# Patient Record
Sex: Female | Born: 1990
Health system: Southern US, Community
[De-identification: ages and names within clinical notes are randomized; demographics above are authoritative.]

## PROBLEM LIST (undated history)

## (undated) ENCOUNTER — Inpatient Hospital Stay (HOSPITAL_COMMUNITY): Payer: Self-pay

## (undated) ENCOUNTER — Ambulatory Visit (HOSPITAL_COMMUNITY): Payer: Self-pay | Source: Home / Self Care

## (undated) DIAGNOSIS — Z8619 Personal history of other infectious and parasitic diseases: Secondary | ICD-10-CM

## (undated) DIAGNOSIS — Z789 Other specified health status: Secondary | ICD-10-CM

## (undated) DIAGNOSIS — N390 Urinary tract infection, site not specified: Secondary | ICD-10-CM

## (undated) HISTORY — PX: TONSILLECTOMY: SUR1361

## (undated) HISTORY — PX: WISDOM TOOTH EXTRACTION: SHX21

## (undated) HISTORY — PX: DILATION AND EVACUATION: SHX1459

## (undated) HISTORY — DX: Personal history of other infectious and parasitic diseases: Z86.19

---
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
## 2012-07-16 ENCOUNTER — Emergency Department (HOSPITAL_COMMUNITY)
Admission: EM | Admit: 2012-07-16 | Discharge: 2012-07-16 | Disposition: A | Discharge status: Home / Self Care | Payer: Medicaid Other | Attending: Emergency Medicine | Admitting: Emergency Medicine

## 2012-07-16 ENCOUNTER — Other Ambulatory Visit: Payer: Self-pay | Admitting: Orthopedic Surgery

## 2012-07-16 ENCOUNTER — Encounter (HOSPITAL_COMMUNITY): Payer: Self-pay | Admitting: Vascular Surgery

## 2012-07-16 ENCOUNTER — Encounter (HOSPITAL_COMMUNITY)
Admission: EM | Disposition: A | Discharge status: Home / Self Care | Payer: Self-pay | Source: Home / Self Care | Attending: Emergency Medicine

## 2012-07-16 DIAGNOSIS — Y939 Activity, unspecified: Secondary | ICD-10-CM | POA: Insufficient documentation

## 2012-07-16 DIAGNOSIS — S61209A Unspecified open wound of unspecified finger without damage to nail, initial encounter: Secondary | ICD-10-CM | POA: Insufficient documentation

## 2012-07-16 DIAGNOSIS — W260XXA Contact with knife, initial encounter: Secondary | ICD-10-CM | POA: Insufficient documentation

## 2012-07-16 DIAGNOSIS — Y929 Unspecified place or not applicable: Secondary | ICD-10-CM | POA: Insufficient documentation

## 2012-07-16 DIAGNOSIS — Z3202 Encounter for pregnancy test, result negative: Secondary | ICD-10-CM | POA: Insufficient documentation

## 2012-07-16 DIAGNOSIS — S56129A Laceration of flexor muscle, fascia and tendon of unspecified finger at forearm level, initial encounter: Secondary | ICD-10-CM

## 2012-07-16 DIAGNOSIS — F911 Conduct disorder, childhood-onset type: Secondary | ICD-10-CM | POA: Insufficient documentation

## 2012-07-16 DIAGNOSIS — F17200 Nicotine dependence, unspecified, uncomplicated: Secondary | ICD-10-CM | POA: Insufficient documentation

## 2012-07-16 LAB — BASIC METABOLIC PANEL
Chloride: 99 mEq/L (ref 96–112)
Creatinine, Ser: 0.75 mg/dL (ref 0.50–1.10)
GFR calc Af Amer: >90 mL/min (ref >90)

## 2012-07-16 LAB — CBC WITH DIFFERENTIAL/PLATELET
Basophils Absolute: 0 10*3/uL (ref 0.0–0.1)
Basophils Relative: 0 % (ref 0–1)
HCT: 44.3 % (ref 36.0–46.0)
MCHC: 34.8 g/dL (ref 30.0–36.0)
Monocytes Absolute: 0.5 10*3/uL (ref 0.1–1.0)
Neutro Abs: 4.6 10*3/uL (ref 1.7–7.7)
Neutrophils Relative %: 59 % (ref 43–77)
RDW: 13.6 % (ref 11.5–15.5)

## 2012-07-16 LAB — POCT PREGNANCY, URINE: Preg Test, Ur: NEGATIVE

## 2012-07-16 SURGERY — NERVE, TENDON AND ARTERY REPAIR
Anesthesia: General | Laterality: Left

## 2012-07-16 MED ORDER — OXYCODONE-ACETAMINOPHEN 5-325 MG PO TABS: 1.0000 | ORAL_TABLET | Freq: Four times a day (QID) | ORAL | Status: DC | PRN

## 2012-07-16 MED ORDER — CEPHALEXIN 500 MG PO CAPS: 500.0000 mg | ORAL_CAPSULE | Freq: Four times a day (QID) | ORAL | Status: DC

## 2012-07-16 MED ORDER — BUPIVACAINE HCL (PF) 0.5 % IJ SOLN
10.0000 mL | Freq: Once | INTRAMUSCULAR | Status: AC
  Administered 2012-07-16: 10 mL
  Filled 2012-07-16: qty 10

## 2012-07-16 NOTE — ED Notes (Signed)
Pt reports to the ED for eval of left index finger laceration. Pt reports she cut her finger on a samurai sword. CMS intact and pt can move finger. Bleeding controlled. Pt anxious and tearful. Pt reports she was just so angry but denies any SI or HI and reports she did not mean to harm herself.

## 2012-07-16 NOTE — Progress Notes (Signed)
Orthopedic Tech Progress Note Patient Details:  Bonnie [name] [birth year] [id number]  Ortho Devices Type of Ortho Device: Ace wrap;Volar splint Ortho Device/Splint Location: left hand Ortho Device/Splint Interventions: Application   Nikki Dom 07/16/2012, 6:52 PM

## 2012-07-16 NOTE — ED Provider Notes (Signed)
History  This chart was scribed for non-physician practitioner working with Raeford Razor, MD by Ardeen Jourdain, ED Scribe. This patient was seen in room TR05C/TR05C and the patient's care was started at 1630.  CSN: [id number]  Arrival date & time 07/16/12  1500   First MD Initiated Contact with Patient 07/16/12 1630      Chief Complaint  Patient presents with  . Finger Injury     Patient is a 22 y.o. female presenting with skin laceration. The history is provided by the patient. No language interpreter was used.  Laceration Location:  Hand Hand laceration location:  L finger Depth:  Cutaneous Quality: straight   Bleeding: controlled   Laceration mechanism:  Knife Pain details:    Quality:  Sharp, shooting and throbbing   Severity:  Moderate   Timing:  Constant   Progression:  Unchanged Foreign body present:  No foreign bodies Worsened by:  Movement Ineffective treatments:  None tried Tetanus status:  Up to date   Bonnie [name] is a 22 y.o. female who presents to the Emergency Department complaining of left index finger laceration that occurred a few hours ago. She states she cut her finger on a samurai sword. Pt is able to move finger and bleeding is controlled. She states she was "just so angry." She denies any SI, HI and states she did not mean to harm herself. She states she "blacked out and went to another place" when the incident occurred. She states she became angry about "man issues." She states her tetanus vaccination is up to date.  History reviewed. No pertinent past medical history.  Past Surgical History  Procedure Laterality Date  . Tonsillectomy    . Dilation and evacuation      History reviewed. No pertinent family history.  History  Substance Use Topics  . Smoking status: Light Tobacco Smoker    Types: Cigarettes  . Smokeless tobacco: Not on file  . Alcohol Use: Yes     Comment: oaccasionally   No OB history available.   Review of  Systems  Constitutional: Negative for fever and chills.  Respiratory: Negative for shortness of breath.   Gastrointestinal: Negative for nausea and vomiting.  Skin: Positive for wound.  Neurological: Negative for weakness.  Psychiatric/Behavioral: Negative for suicidal ideas and self-injury.  All other systems reviewed and are negative.    Allergies  Review of patient's allergies indicates no known allergies.  Home Medications  No current outpatient prescriptions on file.  Triage Vitals: BP 136/87  Pulse 99  Temp(Src) 98.5 F (36.9 C) (Oral)  Resp 16  SpO2 100%  LMP 06/25/2012  Physical Exam  Nursing note and vitals reviewed. Constitutional: She is oriented to person, place, and time. She appears well-developed and well-nourished. No distress.  HENT:  Head: Normocephalic and atraumatic.  Right Ear: External ear normal.  Left Ear: External ear normal.  Eyes: EOM are normal. Pupils are equal, round, and reactive to light.  Neck: Normal range of motion. Neck supple. No tracheal deviation present.  Cardiovascular: Normal rate, regular rhythm and normal heart sounds.  Exam reveals no gallop and no friction rub.   No murmur heard. Pulmonary/Chest: Effort normal and breath sounds normal. No respiratory distress. She has no wheezes. She has no rales. She exhibits no tenderness.  Abdominal: Soft. She exhibits no distension.  Musculoskeletal: Normal range of motion. She exhibits no edema.  Neurological: She is alert and oriented to person, place, and time.  Skin: Skin is warm and  dry. She is not diaphoretic.  Laceration across MIP area of left index finger, no flexion of the digit found  Psychiatric: She has a normal mood and affect. Her behavior is normal. Judgment and thought content normal.    ED Course  Procedures (including critical care time)  DIAGNOSTIC STUDIES: Oxygen Saturation is 100% on room air, normal by my interpretation.    COORDINATION OF CARE:  4:41 PM:  Suspicion of flexor tendon involvement.  Discussed with Dr. Amanda Pea, will be in to see patient.  Labs Reviewed - No data to display No results found.   No diagnosis found.  Patient seen and treated by hand--patient will need surgical repair of tendon--they will see patient in the office tomorrow.  MDM    I personally performed the services described in this documentation, which was scribed in my presence. The recorded information has been reviewed and is accurate.Jimmye Norman, NP 07/16/12 [phone]

## 2012-07-17 ENCOUNTER — Encounter (HOSPITAL_COMMUNITY): Payer: Self-pay | Admitting: Pharmacy Technician

## 2012-07-17 ENCOUNTER — Encounter (HOSPITAL_COMMUNITY): Payer: Self-pay

## 2012-07-18 ENCOUNTER — Ambulatory Visit (HOSPITAL_COMMUNITY)
Admission: RE | Admit: 2012-07-18 | Discharge: 2012-07-18 | Disposition: A | Discharge status: Home / Self Care | Payer: Medicaid Other | Source: Ambulatory Visit | Attending: Orthopedic Surgery | Admitting: Orthopedic Surgery

## 2012-07-18 ENCOUNTER — Encounter (HOSPITAL_COMMUNITY): Payer: Self-pay | Admitting: Anesthesiology

## 2012-07-18 ENCOUNTER — Encounter (HOSPITAL_COMMUNITY): Payer: Self-pay | Admitting: *Deleted

## 2012-07-18 ENCOUNTER — Ambulatory Visit (HOSPITAL_COMMUNITY): Payer: Medicaid Other | Admitting: Anesthesiology

## 2012-07-18 ENCOUNTER — Encounter (HOSPITAL_COMMUNITY)
Admission: RE | Disposition: A | Discharge status: Home / Self Care | Payer: Self-pay | Source: Ambulatory Visit | Attending: Orthopedic Surgery

## 2012-07-18 DIAGNOSIS — S61209A Unspecified open wound of unspecified finger without damage to nail, initial encounter: Secondary | ICD-10-CM | POA: Insufficient documentation

## 2012-07-18 DIAGNOSIS — IMO0002 Reserved for concepts with insufficient information to code with codable children: Secondary | ICD-10-CM | POA: Insufficient documentation

## 2012-07-18 DIAGNOSIS — W260XXA Contact with knife, initial encounter: Secondary | ICD-10-CM | POA: Insufficient documentation

## 2012-07-18 DIAGNOSIS — W261XXA Contact with sword or dagger, initial encounter: Secondary | ICD-10-CM | POA: Insufficient documentation

## 2012-07-18 HISTORY — DX: Urinary tract infection, site not specified: N39.0

## 2012-07-18 HISTORY — PX: I&D EXTREMITY: SHX5045

## 2012-07-18 LAB — SURGICAL PCR SCREEN: Staphylococcus aureus: NEGATIVE

## 2012-07-18 SURGERY — IRRIGATION AND DEBRIDEMENT EXTREMITY
Anesthesia: General | Site: Finger | Laterality: Left | Wound class: Clean

## 2012-07-18 MED ORDER — LIDOCAINE HCL (CARDIAC) 20 MG/ML IV SOLN
INTRAVENOUS | Status: DC | PRN
  Administered 2012-07-18: 60 mg via INTRAVENOUS

## 2012-07-18 MED ORDER — PROPOFOL 10 MG/ML IV BOLUS
INTRAVENOUS | Status: DC | PRN
  Administered 2012-07-18: 200 mg via INTRAVENOUS

## 2012-07-18 MED ORDER — ARTIFICIAL TEARS OP OINT
TOPICAL_OINTMENT | OPHTHALMIC | Status: DC | PRN
  Administered 2012-07-18: 1 via OPHTHALMIC

## 2012-07-18 MED ORDER — OXYCODONE HCL 5 MG PO TABS: 5.0000 mg | ORAL_TABLET | Freq: Once | ORAL | Status: DC | PRN

## 2012-07-18 MED ORDER — HYDROMORPHONE HCL PF 1 MG/ML IJ SOLN: 0.2500 mg | INTRAMUSCULAR | Status: DC | PRN

## 2012-07-18 MED ORDER — MIDAZOLAM HCL 5 MG/5ML IJ SOLN
INTRAMUSCULAR | Status: DC | PRN
  Administered 2012-07-18: 2 mg via INTRAVENOUS

## 2012-07-18 MED ORDER — FENTANYL CITRATE 0.05 MG/ML IJ SOLN
50.0000 ug | INTRAMUSCULAR | Status: DC | PRN
  Administered 2012-07-18: 25 ug via INTRAVENOUS
  Administered 2012-07-18: 50 ug via INTRAVENOUS

## 2012-07-18 MED ORDER — ONDANSETRON HCL 4 MG/2ML IJ SOLN
INTRAMUSCULAR | Status: DC | PRN
  Administered 2012-07-18: 4 mg via INTRAVENOUS

## 2012-07-18 MED ORDER — FENTANYL CITRATE 0.05 MG/ML IJ SOLN
INTRAMUSCULAR | Status: AC
  Administered 2012-07-18: 25 ug
  Filled 2012-07-18: qty 2

## 2012-07-18 MED ORDER — CEFAZOLIN SODIUM-DEXTROSE 2-3 GM-% IV SOLR
2.0000 g | Freq: Once | INTRAVENOUS | Status: AC
  Administered 2012-07-18: 2 g via INTRAVENOUS

## 2012-07-18 MED ORDER — FENTANYL CITRATE 0.05 MG/ML IJ SOLN
INTRAMUSCULAR | Status: DC | PRN
  Administered 2012-07-18: 25 ug via INTRAVENOUS

## 2012-07-18 MED ORDER — LACTATED RINGERS IV SOLN
INTRAVENOUS | Status: DC
  Administered 2012-07-18: 16:00:00 via INTRAVENOUS

## 2012-07-18 MED ORDER — OXYCODONE HCL 5 MG PO TABS
ORAL_TABLET | ORAL | Status: AC
  Administered 2012-07-18: 5 mg
  Filled 2012-07-18: qty 1

## 2012-07-18 MED ORDER — BUPIVACAINE HCL (PF) 0.25 % IJ SOLN
INTRAMUSCULAR | Status: AC
  Filled 2012-07-18: qty 30

## 2012-07-18 MED ORDER — LACTATED RINGERS IV SOLN
INTRAVENOUS | Status: DC | PRN
  Administered 2012-07-18 (×2): via INTRAVENOUS

## 2012-07-18 MED ORDER — MUPIROCIN 2 % EX OINT
TOPICAL_OINTMENT | CUTANEOUS | Status: AC
  Filled 2012-07-18: qty 22

## 2012-07-18 MED ORDER — MUPIROCIN 2 % EX OINT
TOPICAL_OINTMENT | Freq: Two times a day (BID) | CUTANEOUS | Status: DC
  Administered 2012-07-18: 1 via NASAL
  Filled 2012-07-18: qty 22

## 2012-07-18 MED ORDER — 0.9 % SODIUM CHLORIDE (POUR BTL) OPTIME
TOPICAL | Status: DC | PRN
  Administered 2012-07-18: 1000 mL

## 2012-07-18 MED ORDER — SODIUM CHLORIDE 0.9 % IR SOLN
Status: DC | PRN
  Administered 2012-07-18: 3000 mL

## 2012-07-18 MED ORDER — OXYCODONE HCL 5 MG/5ML PO SOLN: 5.0000 mg | Freq: Once | ORAL | Status: DC | PRN

## 2012-07-18 SURGICAL SUPPLY — 50 items
BANDAGE CONFORM 2  STR LF (GAUZE/BANDAGES/DRESSINGS) IMPLANT
BANDAGE ELASTIC 3 VELCRO ST LF (GAUZE/BANDAGES/DRESSINGS) ×2 IMPLANT
BANDAGE ELASTIC 4 VELCRO ST LF (GAUZE/BANDAGES/DRESSINGS) ×2 IMPLANT
BANDAGE GAUZE ELAST BULKY 4 IN (GAUZE/BANDAGES/DRESSINGS) ×4 IMPLANT
CLOTH BEACON ORANGE TIMEOUT ST (SAFETY) ×2 IMPLANT
CORDS BIPOLAR (ELECTRODE) ×2 IMPLANT
CUFF TOURNIQUET SINGLE 18IN (TOURNIQUET CUFF) ×2 IMPLANT
CUFF TOURNIQUET SINGLE 24IN (TOURNIQUET CUFF) IMPLANT
CUFF TOURNIQUET SINGLE 34IN LL (TOURNIQUET CUFF) IMPLANT
CUFF TOURNIQUET SINGLE 44IN (TOURNIQUET CUFF) IMPLANT
DRSG ADAPTIC 3X8 NADH LF (GAUZE/BANDAGES/DRESSINGS) ×2 IMPLANT
DRSG EMULSION OIL 3X3 NADH (GAUZE/BANDAGES/DRESSINGS) ×2 IMPLANT
ELECT REM PT RETURN 9FT ADLT (ELECTROSURGICAL)
ELECTRODE REM PT RTRN 9FT ADLT (ELECTROSURGICAL) IMPLANT
GAUZE XEROFORM 1X8 LF (GAUZE/BANDAGES/DRESSINGS) ×2 IMPLANT
GLOVE BIOGEL M STRL SZ7.5 (GLOVE) ×2 IMPLANT
GLOVE SS BIOGEL STRL SZ 8 (GLOVE) ×1 IMPLANT
GLOVE SUPERSENSE BIOGEL SZ 8 (GLOVE) ×1
GOWN PREVENTION PLUS XLARGE (GOWN DISPOSABLE) ×2 IMPLANT
GOWN STRL NON-REIN LRG LVL3 (GOWN DISPOSABLE) ×6 IMPLANT
GOWN STRL REIN XL XLG (GOWN DISPOSABLE) ×4 IMPLANT
HANDPIECE INTERPULSE COAX TIP (DISPOSABLE)
KIT BASIN OR (CUSTOM PROCEDURE TRAY) ×2 IMPLANT
KIT ROOM TURNOVER OR (KITS) ×2 IMPLANT
MANIFOLD NEPTUNE II (INSTRUMENTS) ×2 IMPLANT
NEEDLE BLUNT 16X1.5 OR ONLY (NEEDLE) ×2 IMPLANT
NEEDLE HYPO 25GX1X1/2 BEV (NEEDLE) ×2 IMPLANT
NS IRRIG 1000ML POUR BTL (IV SOLUTION) ×2 IMPLANT
PACK ORTHO EXTREMITY (CUSTOM PROCEDURE TRAY) ×2 IMPLANT
PAD ARMBOARD 7.5X6 YLW CONV (MISCELLANEOUS) ×4 IMPLANT
PAD CAST 4YDX4 CTTN HI CHSV (CAST SUPPLIES) ×1 IMPLANT
PADDING CAST COTTON 4X4 STRL (CAST SUPPLIES) ×2
SET HNDPC FAN SPRY TIP SCT (DISPOSABLE) IMPLANT
SPEAR EYE SURG WECK-CEL (MISCELLANEOUS) ×2 IMPLANT
SPONGE GAUZE 4X4 12PLY (GAUZE/BANDAGES/DRESSINGS) ×2 IMPLANT
SPONGE LAP 18X18 X RAY DECT (DISPOSABLE) ×2 IMPLANT
SPONGE LAP 4X18 X RAY DECT (DISPOSABLE) ×2 IMPLANT
SUCTION FRAZIER TIP 10 FR DISP (SUCTIONS) ×2 IMPLANT
SUT CHROMIC 5 0 P 3 (SUTURE) ×2 IMPLANT
SUT ETHILON 8 0 BV130 4 (SUTURE) ×2 IMPLANT
SUT FIBERWIRE 4-0 18 DIAM BLUE (SUTURE) ×6
SUT PROLENE 5 0 PS 2 (SUTURE) ×2 IMPLANT
SUTURE FIBERWR 4-0 18 DIA BLUE (SUTURE) ×3 IMPLANT
SYR CONTROL 10ML LL (SYRINGE) IMPLANT
TOWEL OR 17X24 6PK STRL BLUE (TOWEL DISPOSABLE) ×2 IMPLANT
TOWEL OR 17X26 10 PK STRL BLUE (TOWEL DISPOSABLE) ×2 IMPLANT
TUBE ANAEROBIC SPECIMEN COL (MISCELLANEOUS) IMPLANT
TUBE CONNECTING 12X1/4 (SUCTIONS) ×2 IMPLANT
WATER STERILE IRR 1000ML POUR (IV SOLUTION) IMPLANT
YANKAUER SUCT BULB TIP NO VENT (SUCTIONS) IMPLANT

## 2012-07-18 NOTE — Anesthesia Postprocedure Evaluation (Signed)
  Anesthesia Post-op Note  Patient: Bonnie [name]  Procedure(s) Performed: Procedure(s): LEFT IRRIGATION AND DEBRIDEMENT AND REPAIR OF THE INDEX FINGER NERVES AND  TENDON  (Left)  Patient Location: PACU  Anesthesia Type:General  Level of Consciousness: awake and alert   Airway and Oxygen Therapy: Patient Spontanous Breathing  Post-op Pain: mild  Post-op Assessment: Post-op Vital signs reviewed, Patient's Cardiovascular Status Stable, Respiratory Function Stable, Patent Airway, No signs of Nausea or vomiting and Pain level controlled  Post-op Vital Signs: stable  Complications: No apparent anesthesia complications

## 2012-07-18 NOTE — H&P (Signed)
Charlisa A Gudino is an 22 y.o. female.   Chief Complaint: left index finger laceration with tendon and nerve injury HPI: Marland KitchenMarland KitchenPatient presents for evaluation and treatment of the of their upper extremity predicament. The patient denies neck back chest or of abdominal pain. The patient notes that they have no lower extremity problems. The patient from primarily complains of the upper extremity pain noted. Past Medical History  Diagnosis Date  . Urinary tract infection     hx of    Past Surgical History  Procedure Laterality Date  . Tonsillectomy    . Dilation and evacuation    . Wisdom tooth extraction      History reviewed. No pertinent family history. Social History:  reports that she has been smoking Cigarettes.  She has been smoking about 0.00 packs per day. She does not have any smokeless tobacco history on file. She reports that  drinks alcohol. She reports that she uses illicit drugs (Marijuana).  Allergies: No Known Allergies  Medications Prior to Admission  Medication Sig Dispense Refill  . cephALEXin (KEFLEX) 500 MG capsule Take 500 mg by mouth 4 (four) times daily. First dose taken 07/16/2012.  Takes for 10 days.      Marland Kitchen oxyCODONE-acetaminophen (PERCOCET/ROXICET) 5-325 MG per tablet Take 1 tablet by mouth every 6 (six) hours as needed for pain.        Results for orders placed during the hospital encounter of 07/18/12 (from the past 48 hour(s))  SURGICAL PCR SCREEN     Status: None   Collection Time    07/18/12  1:20 PM      Result Value Range   MRSA, PCR NEGATIVE  NEGATIVE   Staphylococcus aureus NEGATIVE  NEGATIVE   Comment:            The Xpert SA Assay (FDA     approved for NASAL specimens     in patients over 57 years of age),     is one component of     a comprehensive surveillance     program.  Test performance has     been validated by [employer] for patients greater     than or equal to 79 year old.     It is not intended     to diagnose infection  nor to     guide or monitor treatment.   No results found.  Review of Systems  Constitutional: Negative.   HENT: Negative.   Eyes: Negative.   Cardiovascular: Negative.   Gastrointestinal: Negative.   Genitourinary: Negative.   Skin: Negative.   Neurological: Negative.   Endo/Heme/Allergies: Negative.   Psychiatric/Behavioral: Negative.     Height 5\' 3"  (1.6 m), weight 62.8 kg (138 lb 7.2 oz), last menstrual period 06/25/2012. Physical Exam left index finger laceration with tendon and nerve injury .Marland KitchenThe patient is alert and oriented in no acute distress the patient complains of pain in the affected upper extremity. The patient is noted to have a normal HEENT exam. Lung fields show equal chest expansion and no shortness of breath abdomen exam is nontender without distention. Lower extremity examination does not show any fracture dislocation or blood clot symptoms. Pelvis is stable neck and back are stable and nontender  Assessment/Plan .Marland KitchenWe are planning surgery for your upper extremity. The risk and benefits of surgery include risk of bleeding infection anesthesia damage to normal structures and failure of the surgery to accomplish its intended goals of relieving symptoms and restoring  function with this in mind we'll going to proceed. I have specifically discussed with the patient the pre-and postoperative regime and the does and don'ts and risk and benefits in great detail. Risk and benefits of surgery also include risk of dystrophy chronic nerve pain failure of the healing process to go onto completion and other inherent risks of surgery The relavent the pathophysiology of the disease/injury process, as well as the alternatives for treatment and postoperative course of action has been discussed in great detail with the patient who desires to proceed.  We will do everything in our power to help you (the patient) restore function to the upper extremity. Is a pleasure to see this patient  today.   Karen Chafe 07/18/2012, 5:58 PM

## 2012-07-18 NOTE — Anesthesia Procedure Notes (Signed)
Procedure Name: LMA Insertion Date/Time: 07/18/2012 6:11 PM Performed by: MCPHAIL, NANCY S Pre-anesthesia Checklist: Patient identified, Timeout performed, Emergency Drugs available, Suction available and Patient being monitored Patient Re-evaluated:Patient Re-evaluated prior to inductionOxygen Delivery Method: Circle system utilized Preoxygenation: Pre-oxygenation with 100% oxygen Intubation Type: IV induction Ventilation: Mask ventilation without difficulty LMA: LMA inserted LMA Size: 4.0 Number of attempts: 1

## 2012-07-18 NOTE — ED Provider Notes (Signed)
Medical screening examination/treatment/procedure(s) were performed by non-physician practitioner and as supervising physician I was immediately available for consultation/collaboration.  Raeford Razor, MD 07/18/12 [phone]

## 2012-07-18 NOTE — Progress Notes (Signed)
Pt sent a visa check card, ss card, driver's license and cellphone to security to hold during surgery

## 2012-07-18 NOTE — Anesthesia Preprocedure Evaluation (Addendum)
Anesthesia Evaluation  Patient identified by MRN, date of birth, ID band Patient awake    Reviewed: Allergy & Precautions, H&P , NPO status , Patient's Chart, lab work & pertinent test results  History of Anesthesia Complications Negative for: history of anesthetic complications  Airway Mallampati: II TM Distance: >3 FB Neck ROM: Full    Dental no notable dental hx. (+) Teeth Intact and Dental Advisory Given   Pulmonary neg pulmonary ROS,  breath sounds clear to auscultation  Pulmonary exam normal       Cardiovascular negative cardio ROS  Rhythm:Regular Rate:Normal     Neuro/Psych negative neurological ROS  negative psych ROS   GI/Hepatic negative GI ROS, Neg liver ROS,   Endo/Other  negative endocrine ROS  Renal/GU negative Renal ROS  negative genitourinary   Musculoskeletal   Abdominal Normal abdominal exam  (+)   Peds negative pediatric ROS (+)  Hematology negative hematology ROS (+)   Anesthesia Other Findings   Reproductive/Obstetrics negative OB ROS - pregnancy test                         Anesthesia Physical Anesthesia Plan  ASA: I  Anesthesia Plan: General   Post-op Pain Management:    Induction: Intravenous  Airway Management Planned: LMA  Additional Equipment:   Intra-op Plan:   Post-operative Plan: Extubation in OR  Informed Consent: I have reviewed the patients History and Physical, chart, labs and discussed the procedure including the risks, benefits and alternatives for the proposed anesthesia with the patient or authorized representative who has indicated his/her understanding and acceptance.   Dental advisory given  Plan Discussed with: CRNA, Anesthesiologist and Surgeon  Anesthesia Plan Comments:         Anesthesia Quick Evaluation

## 2012-07-18 NOTE — Op Note (Signed)
See dict #[id number] Dominica Severin MD

## 2012-07-18 NOTE — Preoperative (Signed)
Beta Blockers   Reason not to administer Beta Blockers:Not Applicable 

## 2012-07-18 NOTE — Transfer of Care (Signed)
Immediate Anesthesia Transfer of Care Note  Patient: Bonnie [name]  Procedure(s) Performed: Procedure(s): LEFT IRRIGATION AND DEBRIDEMENT AND REPAIR OF THE INDEX FINGER NERVES AND  TENDON  (Left)  Patient Location: PACU  Anesthesia Type:General  Level of Consciousness: awake, alert  and oriented  Airway & Oxygen Therapy: Patient Spontanous Breathing and Patient connected to nasal cannula oxygen  Post-op Assessment: Report given to PACU RN and Post -op Vital signs reviewed and stable  Post vital signs: Reviewed and stable  Complications: No apparent anesthesia complications
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
## 2012-07-20 ENCOUNTER — Encounter (HOSPITAL_COMMUNITY): Payer: Self-pay | Admitting: Orthopedic Surgery

## 2012-07-30 ENCOUNTER — Ambulatory Visit: Payer: Medicaid Other | Attending: Orthopedic Surgery | Admitting: Occupational Therapy

## 2012-07-30 DIAGNOSIS — M25549 Pain in joints of unspecified hand: Secondary | ICD-10-CM | POA: Insufficient documentation

## 2012-07-30 DIAGNOSIS — M25649 Stiffness of unspecified hand, not elsewhere classified: Secondary | ICD-10-CM | POA: Insufficient documentation

## 2012-07-30 DIAGNOSIS — IMO0001 Reserved for inherently not codable concepts without codable children: Secondary | ICD-10-CM | POA: Insufficient documentation

## 2012-07-30 DIAGNOSIS — R279 Unspecified lack of coordination: Secondary | ICD-10-CM | POA: Insufficient documentation

## 2012-08-01 ENCOUNTER — Ambulatory Visit: Payer: Medicaid Other | Admitting: Occupational Therapy

## 2012-08-14 ENCOUNTER — Ambulatory Visit: Payer: Medicaid Other | Attending: Orthopedic Surgery | Admitting: Occupational Therapy

## 2012-08-14 DIAGNOSIS — M25649 Stiffness of unspecified hand, not elsewhere classified: Secondary | ICD-10-CM | POA: Insufficient documentation

## 2012-08-14 DIAGNOSIS — M25549 Pain in joints of unspecified hand: Secondary | ICD-10-CM | POA: Insufficient documentation

## 2012-08-14 DIAGNOSIS — R279 Unspecified lack of coordination: Secondary | ICD-10-CM | POA: Insufficient documentation

## 2012-08-14 DIAGNOSIS — IMO0001 Reserved for inherently not codable concepts without codable children: Secondary | ICD-10-CM | POA: Insufficient documentation

## 2012-08-21 ENCOUNTER — Ambulatory Visit: Payer: Medicaid Other | Admitting: Occupational Therapy

## 2012-08-28 ENCOUNTER — Encounter: Payer: Medicaid Other | Admitting: Occupational Therapy

## 2012-09-04 ENCOUNTER — Encounter: Payer: Medicaid Other | Admitting: Occupational Therapy

## 2012-09-12 ENCOUNTER — Encounter: Payer: Medicaid Other | Admitting: Occupational Therapy

## 2012-11-15 ENCOUNTER — Emergency Department (HOSPITAL_COMMUNITY)
Admission: EM | Admit: 2012-11-15 | Discharge: 2012-11-15 | Disposition: A | Discharge status: Home / Self Care | Payer: Medicaid Other | Attending: Emergency Medicine | Admitting: Emergency Medicine

## 2012-11-15 ENCOUNTER — Encounter (HOSPITAL_COMMUNITY): Payer: Self-pay | Admitting: Emergency Medicine

## 2012-11-15 DIAGNOSIS — R51 Headache: Secondary | ICD-10-CM | POA: Insufficient documentation

## 2012-11-15 DIAGNOSIS — F17200 Nicotine dependence, unspecified, uncomplicated: Secondary | ICD-10-CM | POA: Insufficient documentation

## 2012-11-15 DIAGNOSIS — Z3202 Encounter for pregnancy test, result negative: Secondary | ICD-10-CM | POA: Insufficient documentation

## 2012-11-15 DIAGNOSIS — N12 Tubulo-interstitial nephritis, not specified as acute or chronic: Secondary | ICD-10-CM

## 2012-11-15 DIAGNOSIS — M549 Dorsalgia, unspecified: Secondary | ICD-10-CM | POA: Insufficient documentation

## 2012-11-15 DIAGNOSIS — R110 Nausea: Secondary | ICD-10-CM | POA: Insufficient documentation

## 2012-11-15 DIAGNOSIS — R630 Anorexia: Secondary | ICD-10-CM | POA: Insufficient documentation

## 2012-11-15 DIAGNOSIS — Z87440 Personal history of urinary (tract) infections: Secondary | ICD-10-CM | POA: Insufficient documentation

## 2012-11-15 DIAGNOSIS — E876 Hypokalemia: Secondary | ICD-10-CM | POA: Insufficient documentation

## 2012-11-15 DIAGNOSIS — R000 Tachycardia, unspecified: Secondary | ICD-10-CM | POA: Insufficient documentation

## 2012-11-15 DIAGNOSIS — R42 Dizziness and giddiness: Secondary | ICD-10-CM | POA: Insufficient documentation

## 2012-11-15 LAB — CBC WITH DIFFERENTIAL/PLATELET
Basophils Absolute: 0 10*3/uL (ref 0.0–0.1)
Eosinophils Absolute: 0 10*3/uL (ref 0.0–0.7)
Eosinophils Relative: 0 % (ref 0–5)
MCH: 28.9 pg (ref 26.0–34.0)
MCHC: 34.1 g/dL (ref 30.0–36.0)
MCV: 84.6 fL (ref 78.0–100.0)
Monocytes Absolute: 1.9 10*3/uL — ABNORMAL HIGH (ref 0.1–1.0)
Platelets: 166 10*3/uL (ref 150–400)
RDW: 12.9 % (ref 11.5–15.5)

## 2012-11-15 LAB — URINALYSIS, ROUTINE W REFLEX MICROSCOPIC
Bilirubin Urine: NEGATIVE
Nitrite: NEGATIVE
Specific Gravity, Urine: 1.018 (ref 1.005–1.030)
Urobilinogen, UA: 1 mg/dL (ref 0.0–1.0)
pH: 5.5 (ref 5.0–8.0)

## 2012-11-15 LAB — COMPREHENSIVE METABOLIC PANEL
ALT: 13 U/L (ref 0–35)
AST: 17 U/L (ref 0–37)
Calcium: 9.5 mg/dL (ref 8.4–10.5)
Creatinine, Ser: 1.13 mg/dL — ABNORMAL HIGH (ref 0.50–1.10)
GFR calc Af Amer: 80 mL/min — ABNORMAL LOW (ref >90)
Sodium: 131 mEq/L — ABNORMAL LOW (ref 135–145)
Total Protein: 7.7 g/dL (ref 6.0–8.3)

## 2012-11-15 LAB — PREGNANCY, URINE: Preg Test, Ur: NEGATIVE

## 2012-11-15 LAB — URINE MICROSCOPIC-ADD ON

## 2012-11-15 LAB — RAPID STREP SCREEN (MED CTR MEBANE ONLY): Streptococcus, Group A Screen (Direct): NEGATIVE

## 2012-11-15 MED ORDER — POTASSIUM CHLORIDE CRYS ER 20 MEQ PO TBCR
40.0000 meq | EXTENDED_RELEASE_TABLET | Freq: Once | ORAL | Status: AC
  Administered 2012-11-15: 40 meq via ORAL
  Filled 2012-11-15: qty 2

## 2012-11-15 MED ORDER — SODIUM CHLORIDE 0.9 % IV BOLUS (SEPSIS)
1000.0000 mL | Freq: Once | INTRAVENOUS | Status: AC
  Administered 2012-11-15: 1000 mL via INTRAVENOUS

## 2012-11-15 MED ORDER — LEVOFLOXACIN 750 MG PO TABS: 750.0000 mg | ORAL_TABLET | Freq: Every day | ORAL | Status: DC

## 2012-11-15 MED ORDER — KETOROLAC TROMETHAMINE 30 MG/ML IJ SOLN
30.0000 mg | Freq: Once | INTRAMUSCULAR | Status: AC
  Administered 2012-11-15: 30 mg via INTRAVENOUS
  Filled 2012-11-15: qty 1

## 2012-11-15 MED ORDER — IBUPROFEN 800 MG PO TABS
800.0000 mg | ORAL_TABLET | Freq: Once | ORAL | Status: DC
  Filled 2012-11-15: qty 1

## 2012-11-15 MED ORDER — POTASSIUM CHLORIDE 10 MEQ/100ML IV SOLN
10.0000 meq | Freq: Once | INTRAVENOUS | Status: AC
  Administered 2012-11-15: 10 meq via INTRAVENOUS
  Filled 2012-11-15: qty 100

## 2012-11-15 MED ORDER — ACETAMINOPHEN 500 MG PO TABS
1000.0000 mg | ORAL_TABLET | Freq: Once | ORAL | Status: AC
  Administered 2012-11-15: 1000 mg via ORAL
  Filled 2012-11-15: qty 2

## 2012-11-15 MED ORDER — LEVOFLOXACIN 500 MG PO TABS
750.0000 mg | ORAL_TABLET | Freq: Once | ORAL | Status: AC
  Administered 2012-11-15: 750 mg via ORAL
  Filled 2012-11-15 (×2): qty 1

## 2012-11-15 NOTE — Progress Notes (Signed)
   CARE MANAGEMENT ED NOTE 11/15/2012  Patient:  Bonnie [name],Bonnie [name]   Account Number:  [id number]  Date Initiated:  11/15/2012  Documentation initiated by:  Radford Pax  Subjective/Objective Assessment:   Patient presents to ED with fever, back ache and nausea for four days.     Subjective/Objective Assessment Detail:     Action/Plan:   Action/Plan Detail:   Anticipated DC Date:       Status Recommendation to Physician:   Result of Recommendation:    Other ED Services  Consult Working Plan    DC Planning Services  Other  PCP issues    Choice offered to / List presented to:            Status of service:  Completed, signed off  ED Comments:   ED Comments Detail:  Patient reports that she only has an OBGYN but no medial doctor.  Patient stated that we have her medicaid card on file.  Patient reports that she hasn't gotten [name] new Medicaid card in about [name] year.  Instructed patient to call DSS to find out about her medicaid coverage.  Also provided patient with [name] list of pcps who accept medicaid patients. Instructed patient that when she finds [name] new pcp to call and report this to DSS.  Patient verbalized understanding.

## 2012-11-15 NOTE — ED Provider Notes (Signed)
Care assumed at sign out. Patient has pyelo and sign out to reassess patient. She felt better now, tolerated PO. Fever resolved, tachycardia resolved. Was briefly hypotensive but now normotensive on discharge. Will d/c home with course of levaquin. Repeat K level in a week.   Richardean Canal, MD 11/15/12 [phone]

## 2012-11-15 NOTE — ED Notes (Signed)
Pt reports that four days ago she developed fever, nausea, back pain, headache, and dizziness. Pt is hypotensive upon arrival and has a heart rate of 133. Pt denies abdominal pain, however reports 5/10 lower back pain.

## 2012-11-15 NOTE — ED Provider Notes (Signed)
History    CSN: [id number] Arrival date & time 11/15/12  1318  First MD Initiated Contact with Patient 11/15/12 1350     Chief Complaint  Patient presents with  . Fever  . Nausea   (Consider location/radiation/quality/duration/timing/severity/associated sxs/prior Treatment) HPI Comments: 22 yo female with no medical hx, smoker presents with fever, back pain, headache and nausea for four days. Gradually worsening. HA general, mild. No sick contacts, travel, jail exposure or neck stiffness.  All day, improves with rest.  Body aches.  Denies abd pain or vaginal sxs.    Patient is a 22 y.o. female presenting with fever. The history is provided by the patient.  Fever Associated symptoms: chills, headaches and nausea   Associated symptoms: no chest pain, no cough, no diarrhea, no dysuria, no rash and no vomiting    Past Medical History  Diagnosis Date  . Urinary tract infection     hx of   Past Surgical History  Procedure Laterality Date  . Tonsillectomy    . Dilation and evacuation    . Wisdom tooth extraction    . I&d extremity Left 07/18/2012    Procedure: LEFT IRRIGATION AND DEBRIDEMENT AND REPAIR OF THE INDEX FINGER NERVES AND  TENDON ;  Surgeon: Dominica Severin, MD;  Location: [facility] OR;  Service: Orthopedics;  Laterality: Left;   No family history on file. History  Substance Use Topics  . Smoking status: Current Some Day Smoker -- 0.25 packs/day for 2 years    Types: Cigars  . Smokeless tobacco: Not on file  . Alcohol Use: Yes     Comment: oaccasionally   OB History   Grav Para Term Preterm Abortions TAB SAB Ect Mult Living                 Review of Systems  Constitutional: Positive for fever, chills and appetite change.  HENT: Negative for neck pain and neck stiffness.   Respiratory: Negative for cough and shortness of breath.   Cardiovascular: Negative for chest pain.  Gastrointestinal: Positive for nausea. Negative for vomiting, abdominal pain and diarrhea.   Genitourinary: Negative for dysuria.  Skin: Negative for rash.  Neurological: Positive for light-headedness and headaches.    Allergies  Review of patient's allergies indicates no known allergies.  Home Medications   Current Outpatient Rx  Name  Route  Sig  Dispense  Refill  . acetaminophen (TYLENOL) 500 MG tablet   Oral   Take 500 mg by mouth every 6 (six) hours as needed for pain.         Marland Kitchen ibuprofen (ADVIL,MOTRIN) 200 MG tablet   Oral   Take 200 mg by mouth every 6 (six) hours as needed for pain.         . cephALEXin (KEFLEX) 500 MG capsule   Oral   Take 500 mg by mouth 4 (four) times daily. First dose taken 07/16/2012.  Takes for 10 days.         Marland Kitchen oxyCODONE-acetaminophen (PERCOCET/ROXICET) 5-325 MG per tablet   Oral   Take 1 tablet by mouth every 6 (six) hours as needed for pain.          BP 103/58  Pulse 121  Temp(Src) 100.5 F (38.1 C) (Oral)  Resp 18  Ht 5\' 3"  (1.6 m)  Wt 140 lb (63.504 kg)  BMI 24.81 kg/m2  SpO2 100%  LMP 10/31/2012 Physical Exam  Nursing note and vitals reviewed. Constitutional: She is oriented to person, place, and time. She  appears well-developed and well-nourished.  HENT:  Head: Normocephalic and atraumatic.  Mod dry mm, mild erythema posterior pharynx Neck supple, full rom, no pain with neck flexion  Eyes: Conjunctivae are normal. Right eye exhibits no discharge. Left eye exhibits no discharge.  Neck: Normal range of motion. Neck supple. No tracheal deviation present.  Cardiovascular: Regular rhythm.  Tachycardia present.   Pulses:      Radial pulses are 2+ on the right side, and 2+ on the left side.  Pulmonary/Chest: Effort normal and breath sounds normal.  Abdominal: Soft. She exhibits no distension. There is tenderness (mild right sided with deep palpation). There is no guarding.  Musculoskeletal: She exhibits tenderness (right flank). She exhibits no edema.  Neurological: She is alert and oriented to person, place, and  time.  Skin: Skin is warm. No rash noted.  Psychiatric: She has a normal mood and affect.    ED Course  Procedures (including critical care time) Labs Reviewed  CBC WITH DIFFERENTIAL - Abnormal; Notable for the following:    WBC 13.8 (*)    Neutro Abs 10.0 (*)    Monocytes Relative 14 (*)    Monocytes Absolute 1.9 (*)    All other components within normal limits  COMPREHENSIVE METABOLIC PANEL - Abnormal; Notable for the following:    Sodium 131 (*)    Potassium 2.9 (*)    Chloride 94 (*)    Glucose, Bld 105 (*)    Creatinine, Ser 1.13 (*)    Albumin 3.1 (*)    Total Bilirubin 1.4 (*)    GFR calc non Af Amer 69 (*)    GFR calc Af Amer 80 (*)    All other components within normal limits  URINALYSIS, ROUTINE W REFLEX MICROSCOPIC - Abnormal; Notable for the following:    APPearance CLOUDY (*)    Hgb urine dipstick SMALL (*)    Ketones, ur 15 (*)    Protein, ur 100 (*)    Leukocytes, UA MODERATE (*)    All other components within normal limits  URINE MICROSCOPIC-ADD ON - Abnormal; Notable for the following:    Squamous Epithelial / LPF FEW (*)    Bacteria, UA MANY (*)    All other components within normal limits  RAPID STREP SCREEN  CULTURE, GROUP A STREP  PREGNANCY, URINE   No results found. No diagnosis found.  MDM  Concern for pyelonephritis.  2 L fluids given. PO abx.  Pt improved on two rechecks, no abd pain on recheck, benign abdomen. SIRS pos.   Levaquin given. Signed out with plan Dr. Silverio Lay, plan to reassess and either discharge or observation depending on how patient is feeling.    Enid Skeens, MD 11/15/12 [phone]

## 2012-11-15 NOTE — ED Notes (Signed)
Dr. Yao at bedside. 

## 2012-11-17 LAB — CULTURE, GROUP A STREP

## 2012-11-18 LAB — URINE CULTURE: Colony Count: >=100000

## 2013-04-17 ENCOUNTER — Other Ambulatory Visit (HOSPITAL_COMMUNITY)
Admission: RE | Admit: 2013-04-17 | Discharge: 2013-04-17 | Disposition: A | Discharge status: Home / Self Care | Payer: Medicaid Other | Source: Ambulatory Visit | Attending: Nurse Practitioner | Admitting: Nurse Practitioner

## 2013-04-17 ENCOUNTER — Other Ambulatory Visit: Payer: Self-pay | Admitting: Nurse Practitioner

## 2013-04-17 DIAGNOSIS — Z01419 Encounter for gynecological examination (general) (routine) without abnormal findings: Secondary | ICD-10-CM | POA: Insufficient documentation

## 2013-04-17 DIAGNOSIS — N760 Acute vaginitis: Secondary | ICD-10-CM | POA: Insufficient documentation

## 2013-04-17 DIAGNOSIS — Z113 Encounter for screening for infections with a predominantly sexual mode of transmission: Secondary | ICD-10-CM | POA: Insufficient documentation

## 2013-07-08 IMAGING — US US TRANSVAGINAL NON-OB
1 series · 13 of 25 positions shown · non-contrast
Comparison: None.

CLINICAL DATA: Vaginal bleeding and status post abortion 4 days ago
to terminate 11-week pregnancy.

TRANSABDOMINAL AND TRANSVAGINAL ULTRASOUND OF PELVIS
TECHNIQUE: Both transabdominal and transvaginal ultrasound
examinations of the pelvis were performed. Transabdominal technique
was performed for global imaging of the pelvis including uterus,
ovaries, adnexal regions, and pelvic cul-de-sac.

[Series 1: us transvaginal non-ob · 0.30mm/px · 13 of 79 slices shown]
[im 1/79]
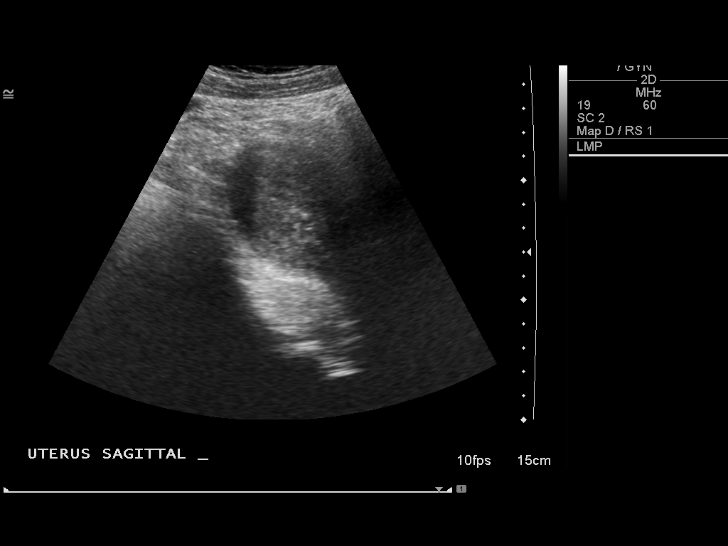
[im 7/79]
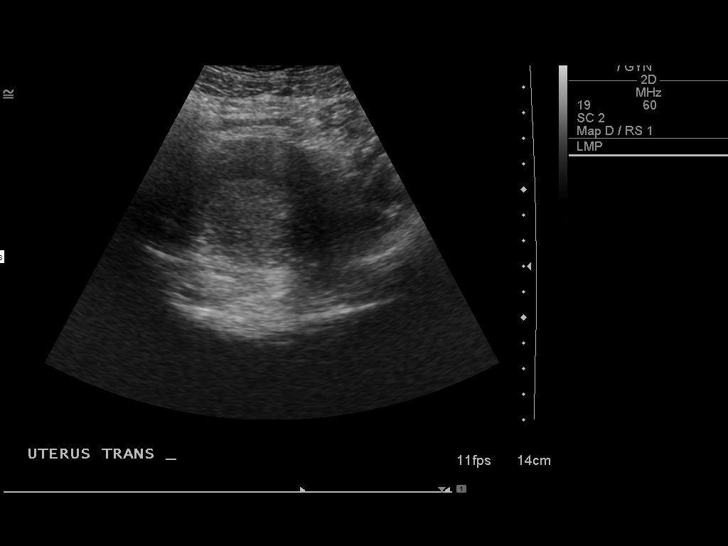
[im 14/79]
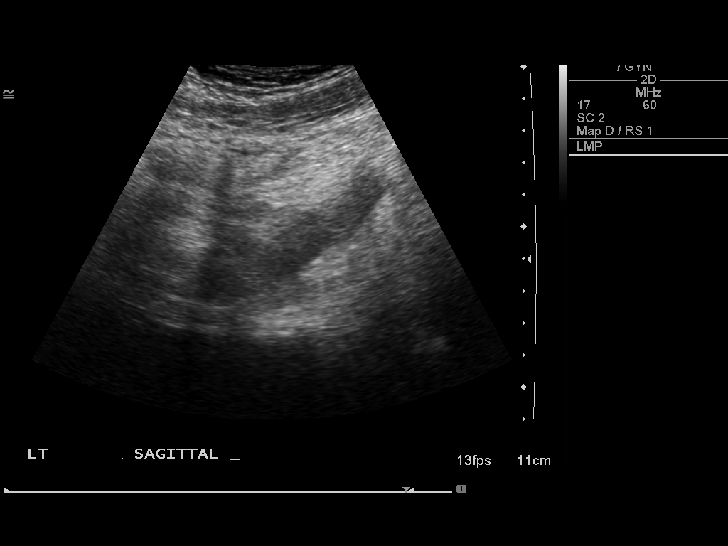
[im 20/79]
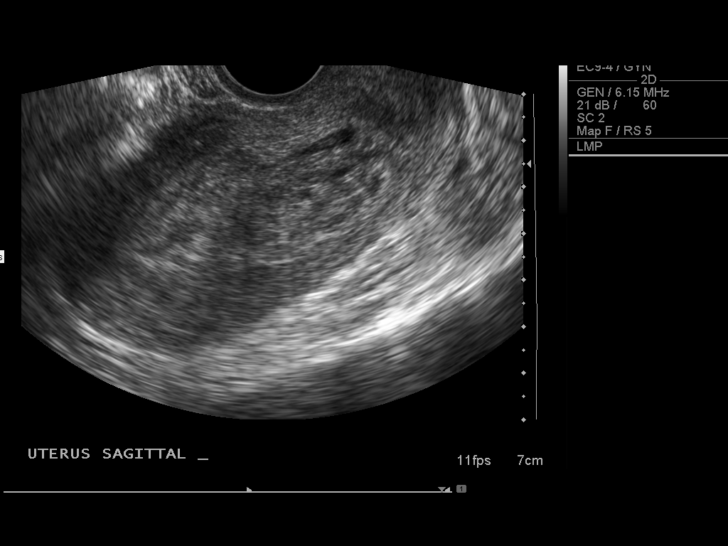
[im 27/79]
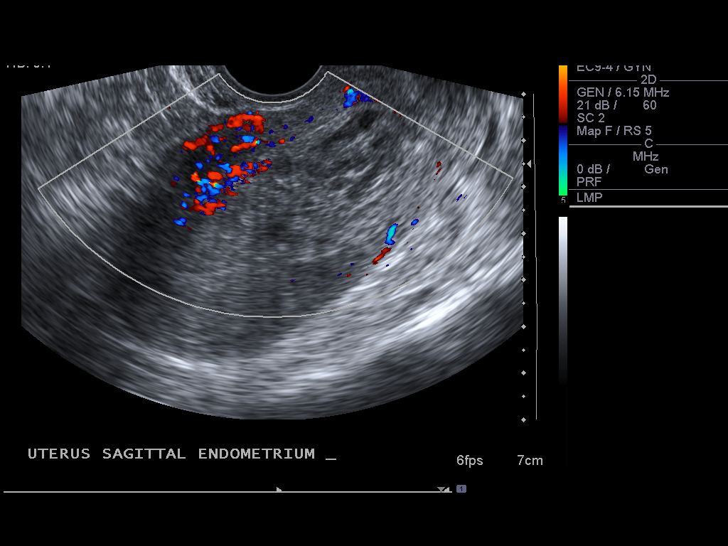
[im 33/79]
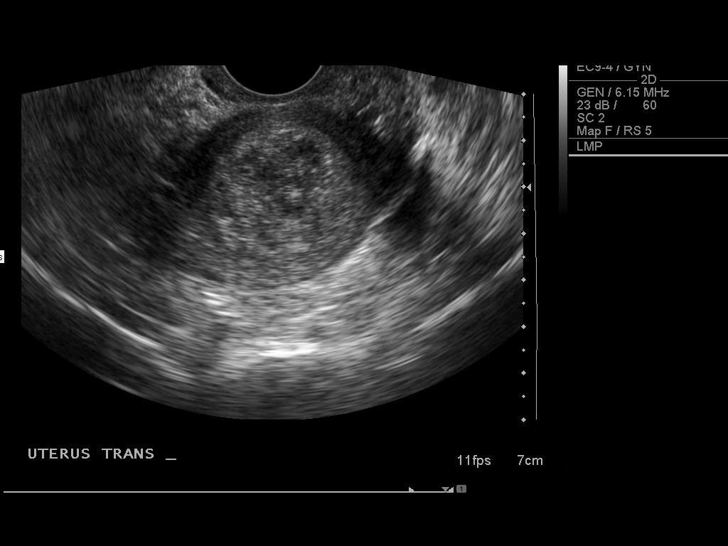
[im 40/79]
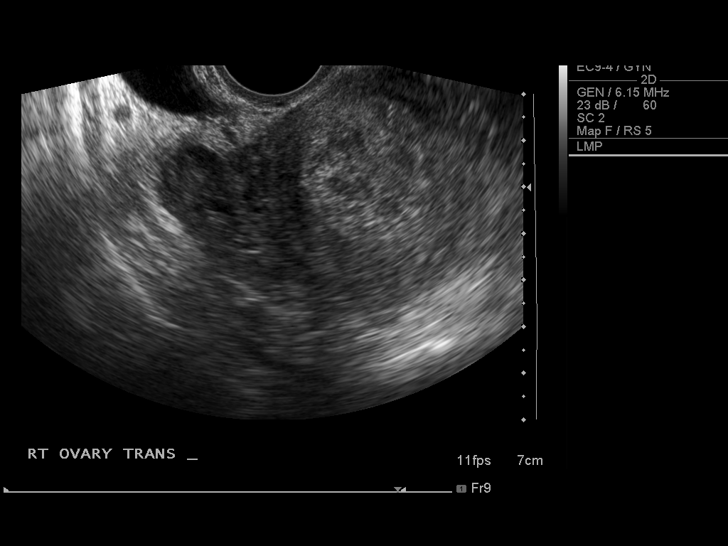
[im 46/79]
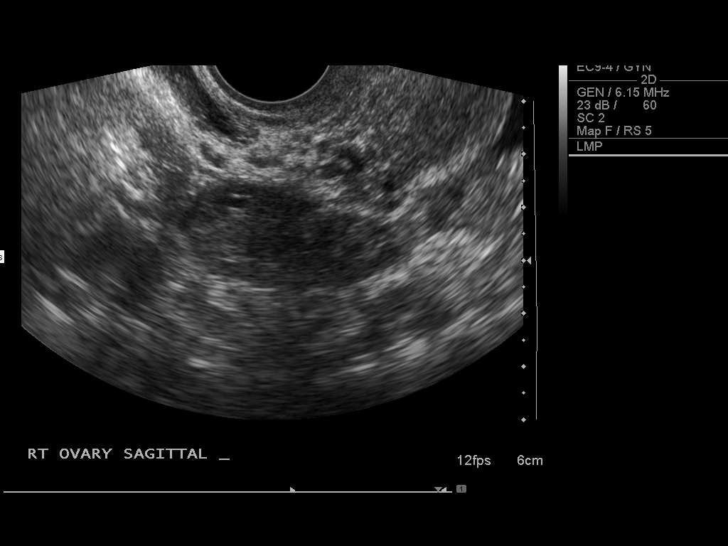
[im 53/79]
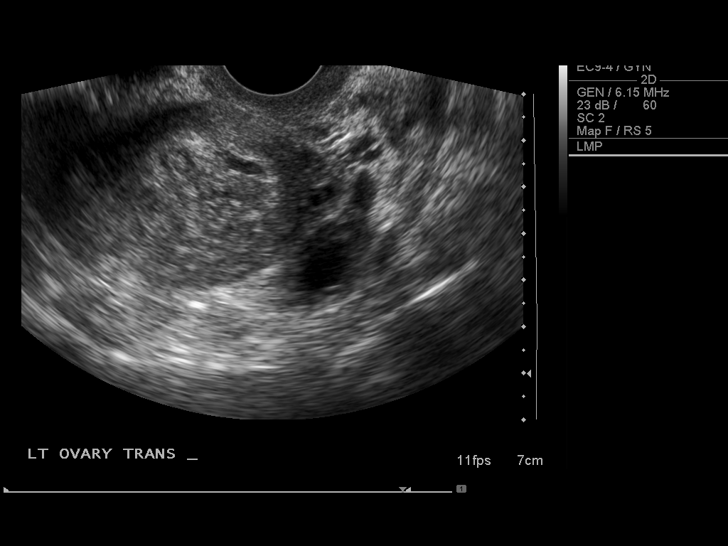
[im 59/79]
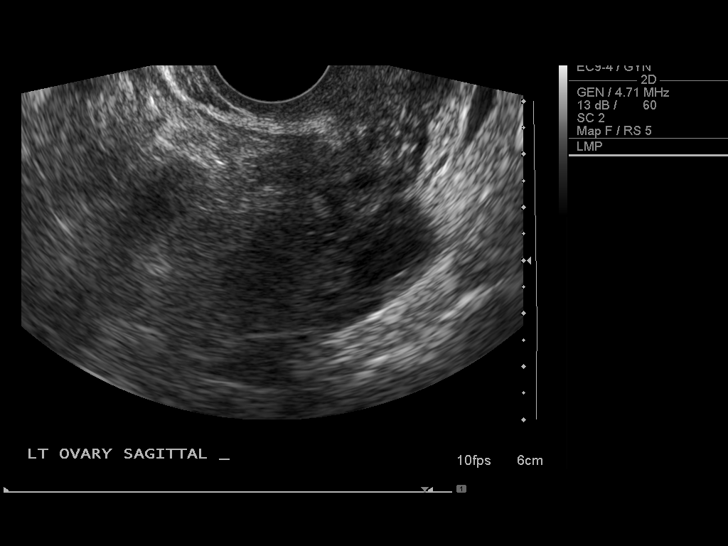
[im 66/79]
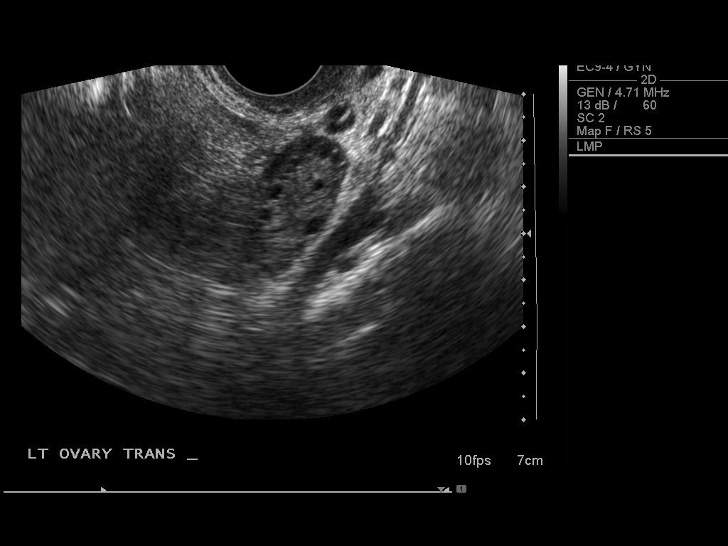
[im 72/79]
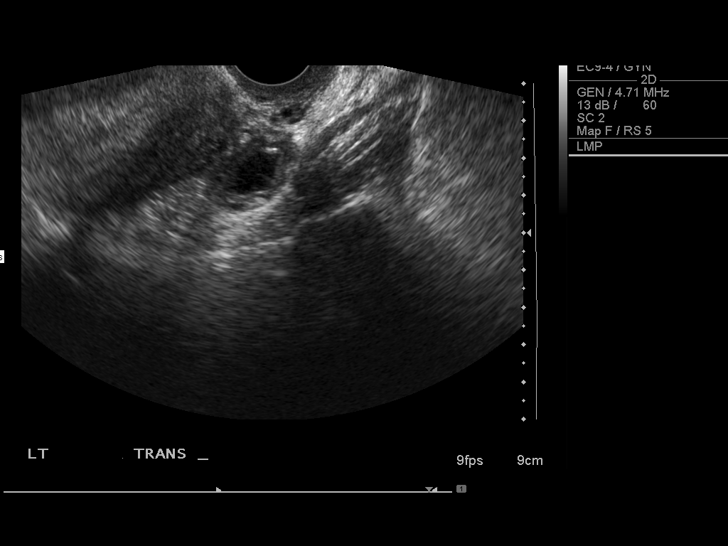
[im 79/79]
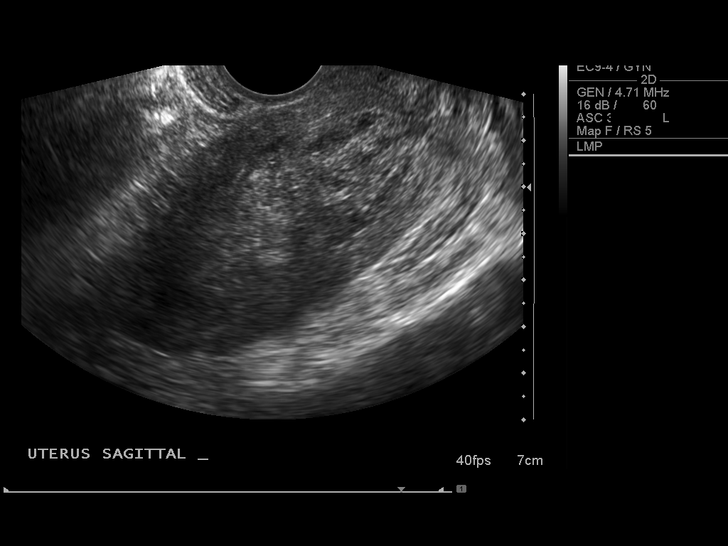

[13 of 25 positions shown; findings below may reference images not displayed]

It was necessary to proceed with endovaginal exam following the
transabdominal exam to visualize the uterus and ovaries.
FINDINGS: Uterus: The endometrial canal is filled with complex material
measuring upwards of 2.5 cm in thickness and showing heterogeneous
appearance by ultrasound.  This material is not particularly
hypervascular by color Doppler interrogation and most likely is
consistent with blood product.  Component of retained products of
conception would be difficult to exclude by ultrasound.  No uterine
masses identified.  The uterus measures approximately 9.5 x 5.0 x
5.6 cm.

Right ovary:  Normal right ovary measuring 4.2 x 1.9 x 1.6 cm

Left ovary: Left ovary measures 3.9 x 2.4 x 1.5 cm and contains a
benign appearing cystic area of 1.2 cm.

Other findings: Trace amount of benign-appearing free fluid present
in the cul-de-sac.
IMPRESSION: Complex and heterogeneous material in the endometrial canal as
above.  This is not particularly hypervascular and may all
represent blood product.  Cannot exclude retained products of
conception.

## 2014-01-07 ENCOUNTER — Encounter (HOSPITAL_COMMUNITY): Payer: Self-pay | Admitting: Emergency Medicine

## 2014-01-07 ENCOUNTER — Emergency Department (INDEPENDENT_AMBULATORY_CARE_PROVIDER_SITE_OTHER)
Admission: EM | Admit: 2014-01-07 | Discharge: 2014-01-07 | Disposition: A | Discharge status: Home / Self Care | Payer: Self-pay | Source: Home / Self Care | Attending: Family Medicine | Admitting: Family Medicine

## 2014-01-07 DIAGNOSIS — B349 Viral infection, unspecified: Secondary | ICD-10-CM

## 2014-01-07 DIAGNOSIS — B9789 Other viral agents as the cause of diseases classified elsewhere: Secondary | ICD-10-CM

## 2014-01-07 DIAGNOSIS — R1084 Generalized abdominal pain: Secondary | ICD-10-CM

## 2014-01-07 LAB — COMPREHENSIVE METABOLIC PANEL
ALBUMIN: 3.6 g/dL (ref 3.5–5.2)
ALK PHOS: 74 U/L (ref 39–117)
ALT: 9 U/L (ref 0–35)
AST: 12 U/L (ref 0–37)
Anion gap: 14 (ref 5–15)
BILIRUBIN TOTAL: 1 mg/dL (ref 0.3–1.2)
BUN: 7 mg/dL (ref 6–23)
CHLORIDE: 99 meq/L (ref 96–112)
CO2: 23 mEq/L (ref 19–32)
Calcium: 9.3 mg/dL (ref 8.4–10.5)
Creatinine, Ser: 0.83 mg/dL (ref 0.50–1.10)
GFR calc Af Amer: >90 mL/min (ref >90)
GFR calc non Af Amer: >90 mL/min (ref >90)
Glucose, Bld: 102 mg/dL — ABNORMAL HIGH (ref 70–99)
POTASSIUM: 3.6 meq/L — AB (ref 3.7–5.3)
Sodium: 136 mEq/L — ABNORMAL LOW (ref 137–147)
Total Protein: 7.8 g/dL (ref 6.0–8.3)

## 2014-01-07 LAB — CBC WITH DIFFERENTIAL/PLATELET
BASOS ABS: 0 10*3/uL (ref 0.0–0.1)
BASOS PCT: 0 % (ref 0–1)
Eosinophils Absolute: 0 10*3/uL (ref 0.0–0.7)
Eosinophils Relative: 0 % (ref 0–5)
HCT: 38.8 % (ref 36.0–46.0)
HEMOGLOBIN: 13.5 g/dL (ref 12.0–15.0)
Lymphocytes Relative: 16 % (ref 12–46)
Lymphs Abs: 1.6 10*3/uL (ref 0.7–4.0)
MCH: 29.2 pg (ref 26.0–34.0)
MCHC: 34.8 g/dL (ref 30.0–36.0)
MCV: 83.8 fL (ref 78.0–100.0)
Monocytes Absolute: 1 10*3/uL (ref 0.1–1.0)
Monocytes Relative: 9 % (ref 3–12)
NEUTROS ABS: 7.8 10*3/uL — AB (ref 1.7–7.7)
NEUTROS PCT: 75 % (ref 43–77)
Platelets: 221 10*3/uL (ref 150–400)
RBC: 4.63 MIL/uL (ref 3.87–5.11)
RDW: 13.2 % (ref 11.5–15.5)
WBC: 10.4 10*3/uL (ref 4.0–10.5)

## 2014-01-07 LAB — POCT URINALYSIS DIP (DEVICE)
BILIRUBIN URINE: NEGATIVE
Glucose, UA: NEGATIVE mg/dL
Ketones, ur: NEGATIVE mg/dL
NITRITE: NEGATIVE
PH: 7 (ref 5.0–8.0)
PROTEIN: 100 mg/dL — AB
Specific Gravity, Urine: 1.02 (ref 1.005–1.030)
Urobilinogen, UA: 1 mg/dL (ref 0.0–1.0)

## 2014-01-07 LAB — LIPASE, BLOOD: LIPASE: 11 U/L (ref 11–59)

## 2014-01-07 LAB — HCG, QUANTITATIVE, PREGNANCY: hCG, Beta Chain, Quant, S: <1 m[IU]/mL (ref <5)

## 2014-01-07 LAB — POCT PREGNANCY, URINE: Preg Test, Ur: NEGATIVE

## 2014-01-07 MED ORDER — ONDANSETRON HCL 4 MG/2ML IJ SOLN: 4.0000 mg | Freq: Once | INTRAMUSCULAR | Status: DC | PRN

## 2014-01-07 MED ORDER — POTASSIUM CHLORIDE CRYS ER 20 MEQ PO TBCR
40.0000 meq | EXTENDED_RELEASE_TABLET | Freq: Once | ORAL | Status: AC
  Administered 2014-01-07: 40 meq via ORAL

## 2014-01-07 MED ORDER — ONDANSETRON 4 MG PO TBDP: 4.0000 mg | ORAL_TABLET | Freq: Three times a day (TID) | ORAL | Status: DC | PRN

## 2014-01-07 MED ORDER — OSELTAMIVIR PHOSPHATE 75 MG PO CAPS: 75.0000 mg | ORAL_CAPSULE | Freq: Two times a day (BID) | ORAL | Status: DC

## 2014-01-07 MED ORDER — ACETAMINOPHEN 325 MG PO TABS
975.0000 mg | ORAL_TABLET | Freq: Once | ORAL | Status: DC
Start: 2014-01-07 — End: 2014-01-07

## 2014-01-07 MED ORDER — SODIUM CHLORIDE 0.9 % IV BOLUS (SEPSIS)
2000.0000 mL | Freq: Once | INTRAVENOUS | Status: AC
  Administered 2014-01-07: 2000 mL via INTRAVENOUS

## 2014-01-07 MED ORDER — ACETAMINOPHEN 500 MG PO TABS
1000.0000 mg | ORAL_TABLET | Freq: Once | ORAL | Status: DC
  Administered 2014-01-07: 1000 mg via ORAL

## 2014-01-07 MED ORDER — ACETAMINOPHEN 325 MG PO TABS
ORAL_TABLET | ORAL | Status: AC
  Filled 2014-01-07: qty 3

## 2014-01-07 MED ORDER — POTASSIUM CHLORIDE CRYS ER 20 MEQ PO TBCR
EXTENDED_RELEASE_TABLET | ORAL | Status: AC
  Filled 2014-01-07: qty 2

## 2014-01-07 NOTE — ED Notes (Signed)
Pt. did not need any Zofran.  Ordered prn.  Pt. said they were going to eat fried chicken.  I told her that would not be wise. Dr. Konrad Dolores notified and told her what she should be eating until she is better, ie soups, salad, toast, baked foods, nothing fried.  Pt. voiced understanding.

## 2014-01-07 NOTE — ED Notes (Signed)
Pt. refused 2nd liter of saline.  Dr. Konrad Dolores notified. Pt. shown to BR.

## 2014-01-07 NOTE — ED Notes (Signed)
C/o RUQ pain onset 2 days Sx include: chills, BA, fevers Denies v/n/d, urinary sx; having normal BM Alert, no signs of acute distress.

## 2014-01-07 NOTE — Discharge Instructions (Signed)
You are doing well overall You are not pregnant You are likely suffering from a viral illness This may be the flu You may benefit from tamiflu but this may make you nauseas Please use zofran as needed for nausea Please use tylenol and ibuprofen for fever Please stay well hydrated and get lots of rest.  Please go to the emergency room if you do not get better or get worse.

## 2014-01-07 NOTE — ED Provider Notes (Signed)
CSN: [id number]     Arrival date & time 01/07/14  1550 History   First MD Initiated Contact with Patient 01/07/14 1624     Chief Complaint  Patient presents with  . Abdominal Pain   (Consider location/radiation/quality/duration/timing/severity/associated sxs/prior Treatment) HPI  Abd pain: started on Monday. Started feeling lightheaded adn week ysterday. Last night pain started radiating from L side to R side. Fevers and chills off and on at home. Occasional nausea w/ quick movements. Poor appetite. Little fluid intake. Daily soft BM. Denies sick contacts, vaginal discharge or pain. Sexually active w/o condoms. General ill feeling LMP 12/12/13.   Past Medical History  Diagnosis Date  . Urinary tract infection     hx of   Past Surgical History  Procedure Laterality Date  . Tonsillectomy    . Dilation and evacuation    . Wisdom tooth extraction    . I&d extremity Left 07/18/2012    Procedure: LEFT IRRIGATION AND DEBRIDEMENT AND REPAIR OF THE INDEX FINGER NERVES AND  TENDON ;  Surgeon: Dominica Severin, MD;  Location: [facility] OR;  Service: Orthopedics;  Laterality: Left;   Family History  Problem Relation Age of Onset  . Hypertension Mother    History  Substance Use Topics  . Smoking status: Former Smoker -- 0.25 packs/day for 2 years    Types: Cigars  . Smokeless tobacco: Not on file  . Alcohol Use: Yes     Comment: oaccasionally   OB History   Grav Para Term Preterm Abortions TAB SAB Ect Mult Living                 Review of Systems Per HPI with all other pertinent systems negative.   Allergies  Review of patient's allergies indicates no known allergies.  Home Medications   Prior to Admission medications   Medication Sig Start Date End Date Taking? Authorizing Provider  acetaminophen (TYLENOL) 500 MG tablet Take 500 mg by mouth every 6 (six) hours as needed for pain.    Historical Provider, MD  cephALEXin (KEFLEX) 500 MG capsule Take 500 mg by mouth 4 (four) times daily.  First dose taken 07/16/2012.  Takes for 10 days.    Historical Provider, MD  ibuprofen (ADVIL,MOTRIN) 200 MG tablet Take 200 mg by mouth every 6 (six) hours as needed for pain.    Historical Provider, MD  levofloxacin (LEVAQUIN) 750 MG tablet Take 1 tablet (750 mg total) by mouth daily. 11/16/12   Richardean Canal, MD  ondansetron (ZOFRAN-ODT) 4 MG disintegrating tablet Take 1 tablet (4 mg total) by mouth every 8 (eight) hours as needed for nausea or vomiting. 01/07/14   Ozella Rocks, MD  oseltamivir (TAMIFLU) 75 MG capsule Take 1 capsule (75 mg total) by mouth 2 (two) times daily. 01/07/14   Ozella Rocks, MD  oxyCODONE-acetaminophen (PERCOCET/ROXICET) 5-325 MG per tablet Take 1 tablet by mouth every 6 (six) hours as needed for pain.    Historical Provider, MD   BP 104/62  Pulse 101  Temp(Src) 98.9 F (37.2 C) (Oral)  Resp 20  SpO2 97%  LMP 12/12/2013 Physical Exam  Constitutional: She is oriented to person, place, and time. She appears well-developed and well-nourished. She appears distressed.  HENT:  Head: Normocephalic and atraumatic.  Eyes: EOM are normal. Pupils are equal, round, and reactive to light.  Neck: Normal range of motion.  Cardiovascular: Normal rate, normal heart sounds and intact distal pulses.   No murmur heard. Pulmonary/Chest: Effort normal.  Abdominal:  Soft.  Mild periumbilical ttp. Non distended. Hypoactive BS  Musculoskeletal: Normal range of motion. She exhibits no edema and no tenderness.  Lymphadenopathy:    She has no cervical adenopathy.  Neurological: She is oriented to person, place, and time. No cranial nerve deficit. She exhibits normal muscle tone. Coordination normal.  Skin: Skin is warm. No rash noted. She is not diaphoretic. No erythema. No pallor.  Psychiatric: She has a normal mood and affect. Her behavior is normal. Judgment and thought content normal.    ED Course  Procedures (including critical care time) Labs Review Labs Reviewed  CBC WITH  DIFFERENTIAL - Abnormal; Notable for the following:    Neutro Abs 7.8 (*)    All other components within normal limits  COMPREHENSIVE METABOLIC PANEL - Abnormal; Notable for the following:    Sodium 136 (*)    Potassium 3.6 (*)    Glucose, Bld 102 (*)    All other components within normal limits  POCT URINALYSIS DIP (DEVICE) - Abnormal; Notable for the following:    Hgb urine dipstick MODERATE (*)    Protein, ur 100 (*)    Leukocytes, UA SMALL (*)    All other components within normal limits  LIPASE, BLOOD  HCG, QUANTITATIVE, PREGNANCY  POCT PREGNANCY, URINE    Imaging Review No results found.   MDM   1. Generalized abdominal pain   2. Viral illness    Extensive workup as above as pt looked poorly on initial presentation to [facility].  WBC nml, lipase nml, CMET unremarkable except for NA and K.  Pt much improved after 1L NS. HR 95 manually. Hyponatremia and hypokalemia prior to 1L NS.  KDUR x1 given for hypokalemia  Likely secondary to viral illness... FLU??? Start Tamiflu as less than 48 hrs out Zofran PRN Fluids, rest, and NSAIDs, Tylenol  Precautions given and all questions answered  Shelly Flatten, MD Family Medicine 01/07/2014, 6:45 PM       Ozella Rocks, MD 01/07/14 [phone]

## 2014-01-08 NOTE — ED Notes (Signed)
Pt. came to [facility] and said she needed her note to say whether she needs restrictions.   Note redone to say no restrictions and given to pt. Bonnie [name] 01/08/2014

## 2014-07-10 ENCOUNTER — Other Ambulatory Visit (HOSPITAL_COMMUNITY)
Admission: RE | Admit: 2014-07-10 | Discharge: 2014-07-10 | Disposition: A | Discharge status: Home / Self Care | Payer: Medicaid Other | Source: Ambulatory Visit | Attending: Obstetrics and Gynecology | Admitting: Obstetrics and Gynecology

## 2014-07-10 DIAGNOSIS — Z01419 Encounter for gynecological examination (general) (routine) without abnormal findings: Secondary | ICD-10-CM | POA: Diagnosis present

## 2014-07-10 DIAGNOSIS — Z113 Encounter for screening for infections with a predominantly sexual mode of transmission: Secondary | ICD-10-CM | POA: Diagnosis present

## 2015-01-04 ENCOUNTER — Emergency Department (HOSPITAL_COMMUNITY)
Admission: EM | Admit: 2015-01-04 | Discharge: 2015-01-04 | Disposition: A | Discharge status: Home / Self Care | Payer: Medicaid Other | Attending: Emergency Medicine | Admitting: Emergency Medicine

## 2015-01-04 ENCOUNTER — Encounter (HOSPITAL_COMMUNITY): Payer: Self-pay

## 2015-01-04 DIAGNOSIS — J3489 Other specified disorders of nose and nasal sinuses: Secondary | ICD-10-CM | POA: Insufficient documentation

## 2015-01-04 DIAGNOSIS — Z79899 Other long term (current) drug therapy: Secondary | ICD-10-CM | POA: Insufficient documentation

## 2015-01-04 DIAGNOSIS — R0981 Nasal congestion: Secondary | ICD-10-CM

## 2015-01-04 DIAGNOSIS — Z87440 Personal history of urinary (tract) infections: Secondary | ICD-10-CM | POA: Insufficient documentation

## 2015-01-04 DIAGNOSIS — Z87891 Personal history of nicotine dependence: Secondary | ICD-10-CM | POA: Insufficient documentation

## 2015-01-04 DIAGNOSIS — Z7951 Long term (current) use of inhaled steroids: Secondary | ICD-10-CM | POA: Insufficient documentation

## 2015-01-04 MED ORDER — OXYMETAZOLINE HCL 0.05 % NA SOLN: 1.0000 | Freq: Two times a day (BID) | NASAL | Status: AC

## 2015-01-04 NOTE — ED Notes (Signed)
Pt alert x4 respirations easy non labored.  

## 2015-01-04 NOTE — Discharge Instructions (Signed)
Oxymetazoline nasal spray What is this medicine? Oxymetazoline (OX ee me TAZ oh leen) is a nasal decongestant. This medicine is used to treat nasal congestion or a stuffy nose. This medicine will not treat an infection. This medicine may be used for other purposes; ask your health care provider or pharmacist if you have questions. COMMON BRAND NAME(S): 12 Hour Nasal, Afrin, Afrin Extra Moisturizing, Afrin Nasal Sinus, Dristan, Duration, Genasal, Mucinex Full Force, Mucinex Moisture Smart, Mucinex Sinus-Max, Nasal Relief, Neo-Synephrine 12-Hour, Neo-Synephrine Severe Sinus Congestion, Sinex 12-Hour, Sudafed OM Sinus Cold Moisturizing, Sudafed OM Sinus Congestion Moisturizing, Vicks Qlearquil Decongestant, Vicks Sinex, Zicam Extreme Congestion Relief, Zicam Intense Sinus What should I tell my health care provider before I take this medicine? They need to know if you have any of these conditions: -diabetes -heart disease -high blood pressure -thyroid disease -trouble urinating due to an enlarged prostate gland -an unusual or allergic reaction to oxymetazoline, other medicines, foods, dyes, or preservatives -pregnant or trying to get pregnant -breast-feeding How should I use this medicine? This medicine is for use in the nose. Do not take by mouth. Follow the directions on the package label. Shake well before using. Use your medicine at regular intervals or as directed by your health care provider. Do not use it more often than directed. Do not use for more than 3 days in a row without advice. Make sure that you are using your nasal spray correctly. Ask your doctor or health care provider if you have any questions. Talk to your pediatrician regarding the use of this medicine in children. While this drug may be prescribed for children for selected conditions, precautions do apply. Overdosage: If you think you've taken too much of this medicine contact a poison control center or emergency room at  once. Overdosage: If you think you have taken too much of this medicine contact a poison control center or emergency room at once. NOTE: This medicine is only for you. Do not share this medicine with others. What if I miss a dose? If you miss a dose, use it as soon as you can. If it is almost time for your next dose, use only that dose. Do not use double or extra doses. What may interact with this medicine? Do not take this medicine with any of the following medications: -MAOIs like Marplan, Nardil, and Parnate This list may not describe all possible interactions. Give your health care provider a list of all the medicines, herbs, non-prescription drugs, or dietary supplements you use. Also tell them if you smoke, drink alcohol, or use illegal drugs. Some items may interact with your medicine. What should I watch for while using this medicine? Tell your doctor or healthcare professional if your symptoms do not start to get better or if they get worse. Do not share this bottle with anyone else as this may spread germs. What side effects may I notice from receiving this medicine? Side effects that you should report to your doctor or health care professional as soon as possible: -allergic reactions like skin rash, itching or hives, swelling of the face, lips, or tongue Side effects that usually do not require medical attention (Report these to your doctor or health care professional if they continue or are bothersome.): -burning, stinging, or irritation in the nose right after use -increased nasal discharge -sneezing This list may not describe all possible side effects. Call your doctor for medical advice about side effects. You may report side effects to FDA at 1-800-FDA-1088. Where   should I keep my medicine? Keep out of the reach of children. Store at room temperature between 20 and 25 degrees C (68 and 77 degrees F). Throw away any unused medicine after the expiration date. NOTE: This sheet is  a summary. It may not cover all possible information. If you have questions about this medicine, talk to your doctor, pharmacist, or health care provider.  2015, Elsevier/Gold Standard. (2010-11-23 14:11:31)

## 2015-01-04 NOTE — ED Provider Notes (Signed)
CSN: [id number]     Arrival date & time 01/04/15  4098 History   First MD Initiated Contact with Patient 01/04/15 0827     Chief Complaint  Patient presents with  . Nasal Congestion  . Insect Bite     (Consider location/radiation/quality/duration/timing/severity/associated sxs/prior Treatment) Patient is a 24 y.o. female presenting with URI. The history is provided by the patient.  URI Presenting symptoms: congestion and rhinorrhea   Presenting symptoms: no cough, no fever and no sore throat   Severity:  Moderate Onset quality:  Gradual Timing:  Constant Progression:  Unchanged Chronicity:  New Relieved by:  Nothing Worsened by:  Nothing tried Ineffective treatments:  Decongestant, prescription medications and OTC medications Associated symptoms: no headaches, no sinus pain and no wheezing   Risk factors: no chronic respiratory disease and no immunosuppression     Past Medical History  Diagnosis Date  . Urinary tract infection     hx of   Past Surgical History  Procedure Laterality Date  . Tonsillectomy    . Dilation and evacuation    . Wisdom tooth extraction    . I&d extremity Left 07/18/2012    Procedure: LEFT IRRIGATION AND DEBRIDEMENT AND REPAIR OF THE INDEX FINGER NERVES AND  TENDON ;  Surgeon: Dominica Severin, MD;  Location: [facility] OR;  Service: Orthopedics;  Laterality: Left;   Family History  Problem Relation Age of Onset  . Hypertension Mother    Social History  Substance Use Topics  . Smoking status: Former Smoker -- 0.25 packs/day for 2 years    Types: Cigars  . Smokeless tobacco: None  . Alcohol Use: Yes     Comment: oaccasionally   OB History    No data available     Review of Systems  Constitutional: Negative for fever.  HENT: Positive for congestion and rhinorrhea. Negative for sore throat.   Respiratory: Negative for cough and wheezing.   Neurological: Negative for headaches.  All other systems reviewed and are negative.     Allergies   Review of patient's allergies indicates no known allergies.  Home Medications   Prior to Admission medications   Medication Sig Start Date End Date Taking? Authorizing Provider  diphenhydrAMINE (BENADRYL) 25 mg capsule Take 25 mg by mouth every 6 (six) hours as needed for allergies.   Yes Historical Provider, MD  fexofenadine (ALLEGRA) 180 MG tablet Take 180 mg by mouth daily.   Yes Historical Provider, MD  fluticasone (FLONASE) 50 MCG/ACT nasal spray Place 1 spray into both nostrils daily.   Yes Historical Provider, MD  guaiFENesin (MUCINEX) 600 MG 12 hr tablet Take 600 mg by mouth 2 (two) times daily as needed for cough or to loosen phlegm.   Yes Historical Provider, MD  pseudoephedrine-acetaminophen (TYLENOL SINUS) 30-500 MG TABS Take 1 tablet by mouth every 4 (four) hours as needed (congestion).   Yes Historical Provider, MD  levofloxacin (LEVAQUIN) 750 MG tablet Take 1 tablet (750 mg total) by mouth daily. Patient not taking: Reported on 01/04/2015 11/16/12   Richardean Canal, MD  ondansetron (ZOFRAN-ODT) 4 MG disintegrating tablet Take 1 tablet (4 mg total) by mouth every 8 (eight) hours as needed for nausea or vomiting. Patient not taking: Reported on 01/04/2015 01/07/14   Ozella Rocks, MD  oseltamivir (TAMIFLU) 75 MG capsule Take 1 capsule (75 mg total) by mouth 2 (two) times daily. Patient not taking: Reported on 01/04/2015 01/07/14   Ozella Rocks, MD   BP 118/69 mmHg  Pulse  73  Temp(Src) 97.6 F (36.4 C) (Oral)  Resp 16  Ht  (1.6 m)  Wt 169 lb (76.658 kg)  BMI 29.94 kg/m2  SpO2 100% Physical Exam  Constitutional: She is oriented to person, place, and time. She appears well-developed and well-nourished. No distress.  HENT:  Head: Normocephalic.  Nose: No rhinorrhea. Right sinus exhibits no maxillary sinus tenderness and no frontal sinus tenderness. Left sinus exhibits no maxillary sinus tenderness and no frontal sinus tenderness.  congestion  Eyes: Conjunctivae are normal.   Neck: Neck supple. No tracheal deviation present.  Cardiovascular: Normal rate, regular rhythm and normal heart sounds.   Pulmonary/Chest: Effort normal and breath sounds normal. No respiratory distress.  Abdominal: Soft. She exhibits no distension.  Neurological: She is alert and oriented to person, place, and time.  Skin: Skin is warm and dry.  Psychiatric: She has a normal mood and affect.    ED Course  Procedures (including critical care time) Labs Review Labs Reviewed - No data to display  Imaging Review No results found. I have personally reviewed and evaluated these images and lab results as part of my medical decision-making.   EKG Interpretation None      MDM   Final diagnoses:  Nasal congestion    24 year old female presents with ongoing nasal congestion for the last several days. She has no fevers, no significant facial pain, does have some sinus pressure and her major complaint is her inability to breathe from her nose. She has been using Flonase for the last few days along with Claritin has had no relief. I recommended a short course of Afrin and other supportive care measures such as neti-pot to help relieve her symptoms at home. No indication for antibiotics at this point. Patient needs a primary care physician for routine follow-up.    Lyndal Pulley, MD 01/05/15 [phone]

## 2015-01-04 NOTE — ED Notes (Addendum)
Patient c/o nasal congestion, nausea and an insect bite to the left and right arm.

## 2015-04-08 ENCOUNTER — Ambulatory Visit (INDEPENDENT_AMBULATORY_CARE_PROVIDER_SITE_OTHER): Payer: BLUE CROSS/BLUE SHIELD | Admitting: Family Medicine

## 2015-04-08 VITALS — BP 132/68 | HR 78 | Temp 98.2°F | Resp 16 | Ht 64.0 in | Wt 188.0 lb

## 2015-04-08 DIAGNOSIS — M436 Torticollis: Secondary | ICD-10-CM

## 2015-04-08 MED ORDER — CYCLOBENZAPRINE HCL 5 MG PO TABS: 5.0000 mg | ORAL_TABLET | Freq: Three times a day (TID) | ORAL | Status: DC | PRN

## 2015-04-08 MED ORDER — PREDNISONE 20 MG PO TABS: ORAL_TABLET | ORAL | Status: DC

## 2015-04-08 NOTE — Progress Notes (Signed)
This chart was scribed for Elvina SidleKurt Blaine Hari, MD by Stann Oresung-Kai Tsai, medical scribe at [facility].The patient was seen in exam room 8 and the patient's care was started at 6:49 PM.  Patient ID: Bonnie [name] MRN: [id number], DOB: June 13, 1990, 24 y.o. Date of Encounter: 04/08/2015  Primary Physician: Default, Provider, MD  Chief Complaint:  Chief Complaint  Patient presents with  . Neck Pain    x 2 days, hurts when turning to the right    HPI:  Bonnie [name] is a 24 y.o. female who presents to [facility] complaining of neck pain that started yesterday. She noticed it when she woke up yesterday morning. She states that it hurts when turning her head to the right. She believes that it's from repetitive motion from work. She used to have this symptom when she was younger. She's taken a muscle relaxer yesterday without relief. She also applied heat on the area.   Past Medical History  Diagnosis Date  . Urinary tract infection     hx of     Home Meds: Prior to Admission medications   Medication Sig Start Date End Date Taking? Authorizing Provider  medroxyPROGESTERone (DEPO-PROVERA) 150 MG/ML injection Inject 150 mg into the muscle every 3 (three) months.   Yes Historical Provider, MD  diphenhydrAMINE (BENADRYL) 25 mg capsule Take 25 mg by mouth every 6 (six) hours as needed for allergies.    Historical Provider, MD  fexofenadine (ALLEGRA) 180 MG tablet Take 180 mg by mouth daily.    Historical Provider, MD  fluticasone (FLONASE) 50 MCG/ACT nasal spray Place 1 spray into both nostrils daily.    Historical Provider, MD  guaiFENesin (MUCINEX) 600 MG 12 hr tablet Take 600 mg by mouth 2 (two) times daily as needed for cough or to loosen phlegm.    Historical Provider, MD  levofloxacin (LEVAQUIN) 750 MG tablet Take 1 tablet (750 mg total) by mouth daily. Patient not taking: Reported on 01/04/2015 11/16/12   Richardean Canalavid H Yao, MD  ondansetron (ZOFRAN-ODT)  4 MG disintegrating tablet Take 1 tablet (4 mg total) by mouth every 8 (eight) hours as needed for nausea or vomiting. Patient not taking: Reported on 01/04/2015 01/07/14   Ozella Rocksavid J Merrell, MD  oseltamivir (TAMIFLU) 75 MG capsule Take 1 capsule (75 mg total) by mouth 2 (two) times daily. Patient not taking: Reported on 01/04/2015 01/07/14   Ozella Rocksavid J Merrell, MD  pseudoephedrine-acetaminophen (TYLENOL SINUS) 30-500 MG TABS Take 1 tablet by mouth every 4 (four) hours as needed (congestion).    Historical Provider, MD    Allergies: No Known Allergies  Social History   Social History  . Marital Status: Married    Spouse Name: N/A  . Number of Children: N/A  . Years of Education: N/A   Occupational History  . Not on file.   Social History Main Topics  . Smoking status: Former Smoker -- 0.25 packs/day for 2 years    Types: Cigars  . Smokeless tobacco: Not on file  . Alcohol Use: 0.0 oz/week    0 Standard drinks or equivalent per week     Comment: oaccasionally  . Drug Use: Yes    Special: Marijuana  . Sexual Activity: Yes    Birth Control/ Protection: Injection   Other Topics Concern  . Not on file   Social History Narrative     Review of Systems: Constitutional: negative for fever, chills, night sweats, weight changes, or fatigue  HEENT: negative for vision changes, hearing loss, congestion, rhinorrhea, ST, epistaxis, or sinus pressure Cardiovascular: negative for chest pain or palpitations Respiratory: negative for hemoptysis, wheezing, shortness of breath, or cough Abdominal: negative for abdominal pain, nausea, vomiting, diarrhea, or constipation Dermatological: negative for rash Neurologic: negative for headache, dizziness, or syncope Musc: positive for neck pain All other systems reviewed and are otherwise negative with the exception to those above and in the HPI.  Physical Exam: Blood pressure 132/68, pulse 78, temperature 98.2 F (36.8 C), resp. rate 16, height   (1.626 m), weight 188 lb (85.276 kg), last menstrual period 03/09/2015., Body mass index is 32.25 kg/(m^2). General: Well developed, well nourished, in no acute distress. Head: Normocephalic, atraumatic, eyes without discharge, sclera non-icteric, nares are without discharge. Bilateral auditory canals clear, TM's are without perforation, pearly grey and translucent with reflective cone of light bilaterally. Oral cavity moist, posterior pharynx without exudate, erythema, peritonsillar abscess, or post nasal drip.  Neck: Supple. No thyromegaly.No lymphadenopathy.; Unable to move head 30 degrees without pain Lungs: Clear bilaterally to auscultation without wheezes, rales, or rhonchi. Breathing is unlabored. Heart: RRR with S1 S2. No murmurs, rubs, or gallops appreciated. Msk:  Strength and tone normal for age. Extremities/Skin: Warm and dry. No clubbing or cyanosis. No edema. No rashes or suspicious lesions. Neuro: Alert and oriented X 3. Moves all extremities spontaneously. Gait is normal. CNII-XII grossly in tact.  Normal BJ and TJ reflexes Psych:  Responds to questions appropriately with a normal affect.    ASSESSMENT AND PLAN:  24 y.o. year old female with  This chart was scribed in my presence and reviewed by me personally.    ICD-9-CM ICD-10-CM   1. Torticollis, acute 723.5 M43.6 predniSONE (DELTASONE) 20 MG tablet     cyclobenzaprine (FLEXERIL) 5 MG tablet    By signing my name below, I, Stann Ore, attest that this documentation has been prepared under the direction and in the presence of Elvina Sidle, MD. Electronically Signed: Stann Ore, Scribe. 04/08/2015 , 6:49 PM .  Signed, Elvina Sidle, MD 04/08/2015 6:49 PM

## 2015-04-08 NOTE — Patient Instructions (Signed)
Acute Torticollis °Torticollis is a condition in which the muscles of the neck tighten (contract) abnormally, causing the neck to twist and the head to move into an unnatural position. Torticollis that develops suddenly is called acute torticollis. If torticollis becomes chronic and is left untreated, the face and neck can become deformed. °CAUSES °This condition may be caused by: °· Sleeping in an awkward position (common). °· Extending or twisting the neck muscles beyond their normal position. °· Infection. °In some cases, the cause may not be known. °SYMPTOMS °Symptoms of this condition include: °· An unnatural position of the head. °· Neck pain. °· A limited ability to move the neck. °· Twisting of the neck to one side. °DIAGNOSIS °This condition is diagnosed with a physical exam. You may also have imaging tests, such as an X-ray, CT scan, or MRI. °TREATMENT °Treatment for this condition involves trying to relax the neck muscles. It may include: °· Medicines or shots. °· Physical therapy. °· Surgery. This may be done in severe cases. °HOME CARE INSTRUCTIONS °· Take medicines only as directed by your health care provider. °· Do stretching exercises and massage your neck as directed by your health care provider. °· Keep all follow-up visits as directed by your health care provider. This is important. °SEEK MEDICAL CARE IF: °· You develop a fever. °SEEK IMMEDIATE MEDICAL CARE IF: °· You develop difficulty breathing. °· You develop noisy breathing (stridor). °· You start drooling. °· You have trouble swallowing or have pain with swallowing. °· You develop numbness or weakness in your hands or feet. °· You have changes in your speech, understanding, or vision. °· Your pain gets worse. °  °This information is not intended to replace advice given to you by your health care provider. Make sure you discuss any questions you have with your health care provider. °  °Document Released: 04/21/2000 Document Revised:  09/08/2014 Document Reviewed: 04/20/2014 °Elsevier Interactive Patient Education ©2016 [employer] Inc. ° °

## 2015-04-11 ENCOUNTER — Telehealth: Payer: Self-pay

## 2015-04-11 ENCOUNTER — Other Ambulatory Visit: Payer: Self-pay | Admitting: Radiology

## 2015-04-11 NOTE — Telephone Encounter (Signed)
Dr L.   Patient came into lobby today to request an OOW note through next Wednesday.  Medication is making her drowsy.  Note passed to your assistant.   Patient in lobby.

## 2015-05-09 NOTE — L&D Delivery Note (Signed)
Operative Delivery Note At 1:06 AM a viable female "Bonnie [name]" was delivered via Vaginal, Spontaneous Delivery. Presentation:vertex; Position: Right,, Occiput,, Anterior; Station: +5.  Delivery of the head: 03/13/2016  1:05 AM First maneuver: 03/13/2016  1:06 AM, Suprapubic Pressure Second maneuver: ,   Third maneuver: ,   Fourth maneuver: ,   Fifth maneuver: ,   Sixth maneuver: ,    Verbal consent: unable to obtain verbal consent due to actively pushing.  APGAR: 2, 5; weight pending.   Placenta status: Complete, intact. To path due to Suspected Triple 1.   Cord: 3 vessels with the following complications: None. Cord pH: NA  Peds (Dr. Eric FormWimmer) called by nursing staff to further evaluate baby.  Anesthesia: Epidural   Episiotomy: None Lacerations: 1st degree left sidewall - required only one interrupted Suture Repair: 3.0 vicryl SH Est. Blood Loss (mL): 300  Mom to postpartum.  Baby to Couplet care / Skin to Skin.  Breastfeeding.  Tylenol effective for upper back pain.  Desires outpatient circumcision.  Birth control to be discussed w/ Dr. Richardson Doppole.  Will continue antibiotics for 24 hours.  Bonnie [name], Bonnie [name] 03/13/2016, 1:52 AM

## 2015-08-02 LAB — OB RESULTS CONSOLE ANTIBODY SCREEN: ANTIBODY SCREEN: NEGATIVE

## 2015-08-02 LAB — OB RESULTS CONSOLE ABO/RH: RH Type: POSITIVE

## 2015-08-02 LAB — OB RESULTS CONSOLE GC/CHLAMYDIA
Chlamydia: NEGATIVE
Gonorrhea: NEGATIVE

## 2015-08-02 LAB — OB RESULTS CONSOLE RUBELLA ANTIBODY, IGM: RUBELLA: IMMUNE

## 2015-08-02 LAB — OB RESULTS CONSOLE RPR: RPR: NONREACTIVE

## 2015-08-02 LAB — OB RESULTS CONSOLE HIV ANTIBODY (ROUTINE TESTING): HIV: NONREACTIVE

## 2015-08-02 LAB — OB RESULTS CONSOLE HEPATITIS B SURFACE ANTIGEN: HEP B S AG: NEGATIVE

## 2015-08-09 ENCOUNTER — Other Ambulatory Visit (HOSPITAL_COMMUNITY)
Admission: RE | Admit: 2015-08-09 | Discharge: 2015-08-09 | Disposition: A | Discharge status: Home / Self Care | Payer: Commercial Managed Care - HMO | Source: Ambulatory Visit | Attending: Obstetrics & Gynecology | Admitting: Obstetrics & Gynecology

## 2015-08-09 ENCOUNTER — Other Ambulatory Visit: Payer: Self-pay | Admitting: Obstetrics & Gynecology

## 2015-08-09 DIAGNOSIS — Z01419 Encounter for gynecological examination (general) (routine) without abnormal findings: Secondary | ICD-10-CM | POA: Diagnosis present

## 2015-08-09 DIAGNOSIS — Z113 Encounter for screening for infections with a predominantly sexual mode of transmission: Secondary | ICD-10-CM | POA: Insufficient documentation

## 2015-08-10 LAB — CYTOLOGY - PAP

## 2015-08-31 ENCOUNTER — Inpatient Hospital Stay (HOSPITAL_COMMUNITY)
Admission: AD | Admit: 2015-08-31 | Discharge: 2015-08-31 | Disposition: A | Discharge status: Home / Self Care | Payer: Commercial Managed Care - HMO | Source: Ambulatory Visit | Attending: Obstetrics and Gynecology | Admitting: Obstetrics and Gynecology

## 2015-08-31 ENCOUNTER — Encounter (HOSPITAL_COMMUNITY): Payer: Self-pay | Admitting: *Deleted

## 2015-08-31 DIAGNOSIS — O23591 Infection of other part of genital tract in pregnancy, first trimester: Secondary | ICD-10-CM | POA: Diagnosis not present

## 2015-08-31 DIAGNOSIS — Z3A12 12 weeks gestation of pregnancy: Secondary | ICD-10-CM | POA: Diagnosis not present

## 2015-08-31 DIAGNOSIS — N760 Acute vaginitis: Secondary | ICD-10-CM | POA: Diagnosis not present

## 2015-08-31 DIAGNOSIS — R102 Pelvic and perineal pain: Secondary | ICD-10-CM | POA: Diagnosis not present

## 2015-08-31 DIAGNOSIS — O26891 Other specified pregnancy related conditions, first trimester: Secondary | ICD-10-CM | POA: Insufficient documentation

## 2015-08-31 DIAGNOSIS — R42 Dizziness and giddiness: Secondary | ICD-10-CM | POA: Diagnosis not present

## 2015-08-31 DIAGNOSIS — A499 Bacterial infection, unspecified: Secondary | ICD-10-CM | POA: Diagnosis not present

## 2015-08-31 DIAGNOSIS — R51 Headache: Secondary | ICD-10-CM | POA: Diagnosis not present

## 2015-08-31 DIAGNOSIS — R1032 Left lower quadrant pain: Secondary | ICD-10-CM | POA: Diagnosis not present

## 2015-08-31 LAB — URINALYSIS, ROUTINE W REFLEX MICROSCOPIC
Bilirubin Urine: NEGATIVE
GLUCOSE, UA: NEGATIVE mg/dL
HGB URINE DIPSTICK: NEGATIVE
KETONES UR: 15 mg/dL — AB
LEUKOCYTES UA: NEGATIVE
Nitrite: NEGATIVE
PROTEIN: NEGATIVE mg/dL
Specific Gravity, Urine: 1.015 (ref 1.005–1.030)
pH: 6.5 (ref 5.0–8.0)

## 2015-08-31 NOTE — MAU Note (Signed)
Been having pelvic pain, pain in LLQ, started yesterday.  Been having migraines, feeling light headed.  Almost every day

## 2015-08-31 NOTE — MAU Provider Note (Signed)
Chief Complaint: Pelvic Pain and Headache   First Provider Initiated Contact with Patient 08/31/15 1601        SUBJECTIVE   Bonnie [name] is a 25 y.o. G2P0010 at [redacted]w[redacted]d by LMP who presents to maternity admissions reporting sharp, intermittent left lower quadrant pain for 2 days.  Also c/o intermittent headaches and dizziness.  Had migraines prior to pregnancy, and used a medication, not sure name.  Has not tried any Tylenol.   Doesn't really have a headache now. She denies vaginal bleeding, vaginal itching/burning, urinary symptoms, h/a, n/v, or fever/chills.  Cannot remember when she ate last. No nausea or vomiting  States is being treated for BV via office with Metrogel  Pelvic Pain The patient's primary symptoms include pelvic pain. The patient's pertinent negatives include no genital itching, genital lesions, vaginal bleeding or vaginal discharge. This is a new problem. The current episode started yesterday. The problem occurs intermittently. The problem has been unchanged. The pain is mild. The problem affects the left side. She is pregnant. Associated symptoms include abdominal pain, constipation and headaches. Pertinent negatives include no chills, diarrhea, dysuria, fever, flank pain, nausea or vomiting. The vaginal discharge was normal. There has been no bleeding. She has not been passing clots. She has not been passing tissue. Nothing aggravates the symptoms. She has tried nothing for the symptoms. She uses nothing for contraception.  Dizziness This is a recurrent problem. The current episode started 1 to 4 weeks ago. The problem occurs intermittently. The problem has been gradually improving. Associated symptoms include abdominal pain and headaches. Pertinent negatives include no chills, fatigue, fever, myalgias, nausea, vertigo, visual change, vomiting or weakness. The symptoms are aggravated by standing. She has tried nothing for the symptoms.   RN Note: Been having pelvic pain,  pain in LLQ, started yesterday. Been having migraines, feeling light headed. Almost every day           Past Medical History  Diagnosis Date  . Urinary tract infection     hx of   Past Surgical History  Procedure Laterality Date  . Tonsillectomy    . Dilation and evacuation    . Wisdom tooth extraction    . I&d extremity Left 07/18/2012    Procedure: LEFT IRRIGATION AND DEBRIDEMENT AND REPAIR OF THE INDEX FINGER NERVES AND  TENDON ;  Surgeon: Dominica Severin, MD;  Location: [facility] OR;  Service: Orthopedics;  Laterality: Left;   Social History   Social History  . Marital Status: Married    Spouse Name: N/A  . Number of Children: N/A  . Years of Education: N/A   Occupational History  . Not on file.   Social History Main Topics  . Smoking status: Former Smoker -- 0.25 packs/day for 2 years    Types: Cigars  . Smokeless tobacco: Not on file  . Alcohol Use: No     Comment: oaccasionally  . Drug Use: No     Comment: last used when found out pregant  . Sexual Activity: Yes    Birth Control/ Protection: None     Comment: last had intercourse on 08/10/15   Other Topics Concern  . Not on file   Social History Narrative   No current facility-administered medications on file prior to encounter.   Current Outpatient Prescriptions on File Prior to Encounter  Medication Sig Dispense Refill  . cyclobenzaprine (FLEXERIL) 5 MG tablet Take 1 tablet (5 mg total) by mouth 3 (three) times daily as needed for muscle spasms.  30 tablet 0  . diphenhydrAMINE (BENADRYL) 25 mg capsule Take 25 mg by mouth every 6 (six) hours as needed for allergies.    . fexofenadine (ALLEGRA) 180 MG tablet Take 180 mg by mouth daily.    . fluticasone (FLONASE) 50 MCG/ACT nasal spray Place 1 spray into both nostrils daily.    Marland Kitchen. guaiFENesin (MUCINEX) 600 MG 12 hr tablet Take 600 mg by mouth 2 (two) times daily as needed for cough or to loosen phlegm.    Marland Kitchen. levofloxacin (LEVAQUIN) 750 MG tablet Take 1 tablet (750  mg total) by mouth daily. (Patient not taking: Reported on 01/04/2015) 10 tablet 0  . medroxyPROGESTERone (DEPO-PROVERA) 150 MG/ML injection Inject 150 mg into the muscle every 3 (three) months.    . ondansetron (ZOFRAN-ODT) 4 MG disintegrating tablet Take 1 tablet (4 mg total) by mouth every 8 (eight) hours as needed for nausea or vomiting. (Patient not taking: Reported on 01/04/2015) 20 tablet 0  . oseltamivir (TAMIFLU) 75 MG capsule Take 1 capsule (75 mg total) by mouth 2 (two) times daily. (Patient not taking: Reported on 01/04/2015) 10 capsule 0  . predniSONE (DELTASONE) 20 MG tablet Two daily with food 10 tablet 0  . pseudoephedrine-acetaminophen (TYLENOL SINUS) 30-500 MG TABS Take 1 tablet by mouth every 4 (four) hours as needed (congestion).     No Known Allergies  I have reviewed patient's Past Medical Hx, Surgical Hx, Family Hx, Social Hx, medications and allergies.   ROS:  Review of Systems  Constitutional: Negative for fever, chills and fatigue.  Gastrointestinal: Positive for abdominal pain and constipation. Negative for nausea, vomiting and diarrhea.  Genitourinary: Positive for pelvic pain. Negative for dysuria, flank pain, vaginal bleeding and vaginal discharge.  Musculoskeletal: Negative for myalgias.  Neurological: Positive for dizziness and headaches. Negative for vertigo and weakness.   Other systems negative  Physical Exam  Patient Vitals for the past 24 hrs:  BP Temp Temp src Pulse Resp  08/31/15 1606 117/73 mmHg - - 95 -  08/31/15 1605 (!) 127/46 mmHg - - 107 -  08/31/15 1604 129/80 mmHg - - 90 -  08/31/15 1603 123/74 mmHg - - 90 -  08/31/15 1533 127/60 mmHg 98.2 F (36.8 C) Oral 84 18   Physical Exam  Constitutional: Well-developed, well-nourished female in no acute distress.  Cardiovascular: normal rate and rhythm Respiratory: normal effort, no distress GI: Abd soft, non-tender except mild tenderness LLQ. Pos BS x 4 MS: Extremities nontender, no edema, normal  ROM Neurologic: Alert and oriented x 4.  GU: Neg CVAT.  PELVIC EXAM: Cervix pink, visually closed, without lesion, scant white creamy discharge, vaginal walls and external genitalia normal Bimanual exam: Cervix 0/long/high, firm, anterior, neg CMT, uterus nontender, nonenlarged, adnexa without tenderness, enlargement, or mass  Bedside US done:  SIngle IUP, size appropriate for dates, normal gestational sac.  Yolk sac not seen.   LAB RESULTS Results for orders placed or performed during the hospital encounter of 08/31/15 (from the past 24 hour(s))  Urinalysis, Routine w reflex microscopic ([facility])     Status: Abnormal   Collection Time: 08/31/15  3:35 PM  Result Value Ref Range   Color, Urine YELLOW YELLOW   APPearance HAZY (A) CLEAR   Specific Gravity, Urine 1.015 1.005 - 1.030   pH 6.5 5.0 - 8.0   Glucose, UA NEGATIVE NEGATIVE mg/dL   Hgb urine dipstick NEGATIVE NEGATIVE   Bilirubin Urine NEGATIVE NEGATIVE   Ketones, ur 15 (A) NEGATIVE mg/dL  Protein, ur NEGATIVE NEGATIVE mg/dL   Nitrite NEGATIVE NEGATIVE   Leukocytes, UA NEGATIVE NEGATIVE       IMAGING No results found.  MAU Management/MDM: Consult Dr Richardson Dopp with presentation, exam findings, and results.    ASSESSMENT Left lower quadrant pain  Dizzy Chronic headaches, History of migraines Intermittent constipation BV  PLAN Discharge home Discussed eating frequent small meals,every 2-4 hours Stressed good water intake Rise slowly Increase fiber in diet Already being treated for BV with Metrogel Follow up as scheduled for new OB next week in office    Medication List    ASK your doctor about these medications        cyclobenzaprine 5 MG tablet  Commonly known as:  FLEXERIL  Take 1 tablet (5 mg total) by mouth 3 (three) times daily as needed for muscle spasms.     diphenhydrAMINE 25 mg capsule  Commonly known as:  BENADRYL  Take 25 mg by mouth every 6 (six) hours as needed for allergies.      fexofenadine 180 MG tablet  Commonly known as:  ALLEGRA  Take 180 mg by mouth daily.     fluticasone 50 MCG/ACT nasal spray  Commonly known as:  FLONASE  Place 1 spray into both nostrils daily.     guaiFENesin 600 MG 12 hr tablet  Commonly known as:  MUCINEX  Take 600 mg by mouth 2 (two) times daily as needed for cough or to loosen phlegm.     levofloxacin 750 MG tablet  Commonly known as:  LEVAQUIN  Take 1 tablet (750 mg total) by mouth daily.     medroxyPROGESTERone 150 MG/ML injection  Commonly known as:  DEPO-PROVERA  Inject 150 mg into the muscle every 3 (three) months.     ondansetron 4 MG disintegrating tablet  Commonly known as:  ZOFRAN-ODT  Take 1 tablet (4 mg total) by mouth every 8 (eight) hours as needed for nausea or vomiting.     oseltamivir 75 MG capsule  Commonly known as:  TAMIFLU  Take 1 capsule (75 mg total) by mouth 2 (two) times daily.     predniSONE 20 MG tablet  Commonly known as:  DELTASONE  Two daily with food     pseudoephedrine-acetaminophen 30-500 MG Tabs tablet  Commonly known as:  TYLENOL SINUS  Take 1 tablet by mouth every 4 (four) hours as needed (congestion).        Pt stable at time of discharge. Encouraged to return here or to other Urgent Care/ED if she develops worsening of symptoms, increase in pain, fever, or other concerning symptoms.    Wynelle Bourgeois CNM, MSN Certified Nurse-Midwife 08/31/2015  4:21 PM

## 2015-08-31 NOTE — Discharge Instructions (Signed)
Abdominal Pain During Pregnancy Abdominal pain is common in pregnancy. Most of the time, it does not cause harm. There are many causes of abdominal pain. Some causes are more serious than others. Some of the causes of abdominal pain in pregnancy are easily diagnosed. Occasionally, the diagnosis takes time to understand. Other times, the cause is not determined. Abdominal pain can be a sign that something is very wrong with the pregnancy, or the pain may have nothing to do with the pregnancy at all. For this reason, always tell your health care provider if you have any abdominal discomfort. HOME CARE INSTRUCTIONS  Monitor your abdominal pain for any changes. The following actions may help to alleviate any discomfort you are experiencing:  Do not have sexual intercourse or put anything in your vagina until your symptoms go away completely.  Get plenty of rest until your pain improves.  Drink clear fluids if you feel nauseous. Avoid solid food as long as you are uncomfortable or nauseous.  Only take over-the-counter or prescription medicine as directed by your health care provider.  Keep all follow-up appointments with your health care provider. SEEK IMMEDIATE MEDICAL CARE IF:  You are bleeding, leaking fluid, or passing tissue from the vagina.  You have increasing pain or cramping.  You have persistent vomiting.  You have painful or bloody urination.  You have a fever.  You notice a decrease in your baby's movements.  You have extreme weakness or feel faint.  You have shortness of breath, with or without abdominal pain.  You develop a severe headache with abdominal pain.  You have abnormal vaginal discharge with abdominal pain.  You have persistent diarrhea.  You have abdominal pain that continues even after rest, or gets worse. MAKE SURE YOU:   Understand these instructions.  Will watch your condition.  Will get help right away if you are not doing well or get worse.     This information is not intended to replace advice given to you by your health care provider. Make sure you discuss any questions you have with your health care provider.   Document Released: 04/24/2005 Document Revised: 02/12/2013 Document Reviewed: 11/21/2012 Elsevier Interactive Patient Education 2016 [employer] Dizziness Dizziness is a common problem. It is a feeling of unsteadiness or light-headedness. You may feel like you are about to faint. Dizziness can lead to injury if you stumble or fall. Anyone can become dizzy, but dizziness is more common in older adults. This condition can be caused by a number of things, including medicines, dehydration, or illness. HOME CARE INSTRUCTIONS Taking these steps may help with your condition: Eating and Drinking  Drink enough fluid to keep your urine clear or pale yellow. This helps to keep you from becoming dehydrated. Try to drink more clear fluids, such as water.  Do not drink alcohol.  Limit your caffeine intake if directed by your health care provider.  Limit your salt intake if directed by your health care provider. Activity  Avoid making quick movements.  Rise slowly from chairs and steady yourself until you feel okay.  In the morning, first sit up on the side of the bed. When you feel okay, stand slowly while you hold onto something until you know that your balance is fine.  Move your legs often if you need to stand in one place for a long time. Tighten and relax your muscles in your legs while you are standing.  Do not drive or operate heavy machinery if you feel  dizzy. °· Avoid bending down if you feel dizzy. Place items in your home so that they are easy for you to reach without leaning over. °Lifestyle °· Do not use any tobacco products, including cigarettes, chewing tobacco, or electronic cigarettes. If you need help quitting, ask your health care provider. °· Try to reduce your stress level, such as with yoga or meditation.  Talk with your health care provider if you need help. °General Instructions °· Watch your dizziness for any changes. °· Take medicines only as directed by your health care provider. Talk with your health care provider if you think that your dizziness is caused by a medicine that you are taking. °· Tell a friend or a family member that you are feeling dizzy. If he or she notices any changes in your behavior, have this person call your health care provider. °· Keep all follow-up visits as directed by your health care provider. This is important. °SEEK MEDICAL CARE IF: °· Your dizziness does not go away. °· Your dizziness or light-headedness gets worse. °· You feel nauseous. °· You have reduced hearing. °· You have new symptoms. °· You are unsteady on your feet or you feel like the room is spinning. °SEEK IMMEDIATE MEDICAL CARE IF: °· You vomit or have diarrhea and are unable to eat or drink anything. °· You have problems talking, walking, swallowing, or using your arms, hands, or legs. °· You feel generally weak. °· You are not thinking clearly or you have trouble forming sentences. It may take a friend or family member to notice this. °· You have chest pain, abdominal pain, shortness of breath, or sweating. °· Your vision changes. °· You notice any bleeding. °· You have a headache. °· You have neck pain or a stiff neck. °· You have a fever. °  °This information is not intended to replace advice given to you by your health care provider. Make sure you discuss any questions you have with your health care provider. °  °Document Released: 10/18/2000 Document Revised: 09/08/2014 Document Reviewed: 04/20/2014 °Elsevier Interactive Patient Education ©2016 Elsevier Inc[employer] ° °

## 2016-01-27 ENCOUNTER — Telehealth: Payer: Self-pay | Admitting: Obstetrics and Gynecology

## 2016-01-27 NOTE — Telephone Encounter (Addendum)
TC from patient of Dr. Benjaman Kindlerzan--33 5/7 weeks, swelling in both ankles, right > left, x 1-2 weeks, "just worse" after working. Has 2 jobs, one is sitting, one is standing x 8 hours.  Went home last night and soaked and iced area.  Has not taken any Tylenol.  Recommended going home tonight, elevating feet, applying ice to right ankle, consider ace wrap for support.  Also recommended she call [facility] in am for evaluation.  Denies any other issues, has good FM. Had hx of left calf strain from stretch.

## 2016-01-31 ENCOUNTER — Ambulatory Visit: Payer: Commercial Managed Care - HMO | Admitting: Physical Therapy

## 2016-02-08 ENCOUNTER — Ambulatory Visit: Payer: Self-pay | Admitting: Physical Therapy

## 2016-02-14 ENCOUNTER — Ambulatory Visit: Payer: Commercial Managed Care - HMO | Admitting: Physical Therapy

## 2016-02-21 ENCOUNTER — Ambulatory Visit: Payer: Commercial Managed Care - HMO | Attending: Family Medicine | Admitting: Physical Therapy

## 2016-02-24 LAB — OB RESULTS CONSOLE GBS: STREP GROUP B AG: NEGATIVE

## 2016-03-10 ENCOUNTER — Encounter (HOSPITAL_COMMUNITY): Payer: Self-pay

## 2016-03-10 ENCOUNTER — Inpatient Hospital Stay (HOSPITAL_COMMUNITY)
Admission: AD | Admit: 2016-03-10 | Discharge: 2016-03-10 | Disposition: A | Discharge status: Home / Self Care | Payer: BLUE CROSS/BLUE SHIELD | Source: Ambulatory Visit | Attending: Obstetrics and Gynecology | Admitting: Obstetrics and Gynecology

## 2016-03-10 ENCOUNTER — Telehealth (HOSPITAL_COMMUNITY): Payer: Self-pay | Admitting: *Deleted

## 2016-03-10 ENCOUNTER — Encounter (HOSPITAL_COMMUNITY): Payer: Self-pay | Admitting: *Deleted

## 2016-03-10 NOTE — MAU Note (Signed)
Urine sent to lab 

## 2016-03-10 NOTE — MAU Note (Signed)
Contractions since 0600 this morning now every 5 minutes lasting 10 to 20 seconds, MD office told her to come, 0 cm yesterday.

## 2016-03-10 NOTE — Telephone Encounter (Signed)
Preadmission screen  

## 2016-03-11 ENCOUNTER — Inpatient Hospital Stay (HOSPITAL_COMMUNITY)
Admission: AD | Admit: 2016-03-11 | Discharge: 2016-03-11 | Disposition: A | Discharge status: Home / Self Care | Payer: BLUE CROSS/BLUE SHIELD | Source: Ambulatory Visit | Attending: Obstetrics & Gynecology | Admitting: Obstetrics & Gynecology

## 2016-03-11 ENCOUNTER — Encounter (HOSPITAL_COMMUNITY): Payer: Self-pay | Admitting: *Deleted

## 2016-03-11 ENCOUNTER — Telehealth: Payer: Self-pay

## 2016-03-11 NOTE — MAU Note (Signed)
Contracting since 0500, now coming every 5 min. Denies bleeding or leaking.

## 2016-03-11 NOTE — Telephone Encounter (Signed)
Patient call reports lose of mucous plug and contractions.  Patient states that she has been having contractions every 2-3 minutes. Patient endorses fetal movement and did not assess vaginal bleeding, but states mucous plug was brownish. Patient reports contractions as 10/10, but is able to talk through contractions. Patient does not desire epidural for labor management.  Patient states she was seen in MAU earlier and was 1 cm.  Reassurances given.  Patient informed that she can come to the hospital for evaluation or remain at home.  Patient states that she has taken shower and ambulated, but has gotten no relief.  Patient also taken a tylenol pm with no relief. Patient states that she would like to remain at home.  Patient given further instructions for mgmt including rest, hydration, minimal ambulation.  Patient informed to report for VB, LOF, constant rectal pressure, or if unable to properly cope.  Patient without further q/c.  Encouraged to call back if any should arise or report to MAU for evaluation.  JE, CNM

## 2016-03-11 NOTE — Discharge Instructions (Signed)
Fetal Movement Counts °Patient Name: __________________________________________________ Patient Due Date: ____________________ °Performing a fetal movement count is highly recommended in high-risk pregnancies, but it is good for every pregnant woman to do. Your health care provider may ask you to start counting fetal movements at 28 weeks of the pregnancy. Fetal movements often increase: °· After eating a full meal. °· After physical activity. °· After eating or drinking something sweet or cold. °· At rest. °Pay attention to when you feel the baby is most active. This will help you notice a pattern of your baby's sleep and wake cycles and what factors contribute to an increase in fetal movement. It is important to perform a fetal movement count at the same time each day when your baby is normally most active.  °HOW TO COUNT FETAL MOVEMENTS °1. Find a quiet and comfortable area to sit or lie down on your left side. Lying on your left side provides the best blood and oxygen circulation to your baby. °2. Write down the day and time on a sheet of paper or in a journal. °3. Start counting kicks, flutters, swishes, rolls, or jabs in a 2-hour period. You should feel at least 10 movements within 2 hours. °4. If you do not feel 10 movements in 2 hours, wait 2-3 hours and count again. Look for a change in the pattern or not enough counts in 2 hours. °SEEK MEDICAL CARE IF: °· You feel less than 10 counts in 2 hours, tried twice. °· There is no movement in over an hour. °· The pattern is changing or taking longer each day to reach 10 counts in 2 hours. °· You feel the baby is not moving as he or she usually does. °Date: ____________ Movements: ____________ Start time: ____________ Finish time: ____________  °Date: ____________ Movements: ____________ Start time: ____________ Finish time: ____________ °Date: ____________ Movements: ____________ Start time: ____________ Finish time: ____________ °Date: ____________ Movements:  ____________ Start time: ____________ Finish time: ____________ °Date: ____________ Movements: ____________ Start time: ____________ Finish time: ____________ °Date: ____________ Movements: ____________ Start time: ____________ Finish time: ____________ °Date: ____________ Movements: ____________ Start time: ____________ Finish time: ____________ °Date: ____________ Movements: ____________ Start time: ____________ Finish time: ____________  °Date: ____________ Movements: ____________ Start time: ____________ Finish time: ____________ °Date: ____________ Movements: ____________ Start time: ____________ Finish time: ____________ °Date: ____________ Movements: ____________ Start time: ____________ Finish time: ____________ °Date: ____________ Movements: ____________ Start time: ____________ Finish time: ____________ °Date: ____________ Movements: ____________ Start time: ____________ Finish time: ____________ °Date: ____________ Movements: ____________ Start time: ____________ Finish time: ____________ °Date: ____________ Movements: ____________ Start time: ____________ Finish time: ____________  °Date: ____________ Movements: ____________ Start time: ____________ Finish time: ____________ °Date: ____________ Movements: ____________ Start time: ____________ Finish time: ____________ °Date: ____________ Movements: ____________ Start time: ____________ Finish time: ____________ °Date: ____________ Movements: ____________ Start time: ____________ Finish time: ____________ °Date: ____________ Movements: ____________ Start time: ____________ Finish time: ____________ °Date: ____________ Movements: ____________ Start time: ____________ Finish time: ____________ °Date: ____________ Movements: ____________ Start time: ____________ Finish time: ____________  °Date: ____________ Movements: ____________ Start time: ____________ Finish time: ____________ °Date: ____________ Movements: ____________ Start time: ____________ Finish  time: ____________ °Date: ____________ Movements: ____________ Start time: ____________ Finish time: ____________ °Date: ____________ Movements: ____________ Start time: ____________ Finish time: ____________ °Date: ____________ Movements: ____________ Start time: ____________ Finish time: ____________ °Date: ____________ Movements: ____________ Start time: ____________ Finish time: ____________ °Date: ____________ Movements: ____________ Start time: ____________ Finish time: ____________  °Date: ____________ Movements: ____________ Start time: ____________ Finish   time: ____________ °Date: ____________ Movements: ____________ Start time: ____________ Finish time: ____________ °Date: ____________ Movements: ____________ Start time: ____________ Finish time: ____________ °Date: ____________ Movements: ____________ Start time: ____________ Finish time: ____________ °Date: ____________ Movements: ____________ Start time: ____________ Finish time: ____________ °Date: ____________ Movements: ____________ Start time: ____________ Finish time: ____________ °Date: ____________ Movements: ____________ Start time: ____________ Finish time: ____________  °Date: ____________ Movements: ____________ Start time: ____________ Finish time: ____________ °Date: ____________ Movements: ____________ Start time: ____________ Finish time: ____________ °Date: ____________ Movements: ____________ Start time: ____________ Finish time: ____________ °Date: ____________ Movements: ____________ Start time: ____________ Finish time: ____________ °Date: ____________ Movements: ____________ Start time: ____________ Finish time: ____________ °Date: ____________ Movements: ____________ Start time: ____________ Finish time: ____________ °Date: ____________ Movements: ____________ Start time: ____________ Finish time: ____________  °Date: ____________ Movements: ____________ Start time: ____________ Finish time: ____________ °Date: ____________  Movements: ____________ Start time: ____________ Finish time: ____________ °Date: ____________ Movements: ____________ Start time: ____________ Finish time: ____________ °Date: ____________ Movements: ____________ Start time: ____________ Finish time: ____________ °Date: ____________ Movements: ____________ Start time: ____________ Finish time: ____________ °Date: ____________ Movements: ____________ Start time: ____________ Finish time: ____________ °Date: ____________ Movements: ____________ Start time: ____________ Finish time: ____________  °Date: ____________ Movements: ____________ Start time: ____________ Finish time: ____________ °Date: ____________ Movements: ____________ Start time: ____________ Finish time: ____________ °Date: ____________ Movements: ____________ Start time: ____________ Finish time: ____________ °Date: ____________ Movements: ____________ Start time: ____________ Finish time: ____________ °Date: ____________ Movements: ____________ Start time: ____________ Finish time: ____________ °Date: ____________ Movements: ____________ Start time: ____________ Finish time: ____________ °  °This information is not intended to replace advice given to you by your health care provider. Make sure you discuss any questions you have with your health care provider. °  °Document Released: 05/24/2006 Document Revised: 05/15/2014 Document Reviewed: 02/19/2012 °[employer] Interactive Patient Education ©2016 [employer] °Vaginal Delivery °During delivery, your health care provider will help you give birth to your baby. During a vaginal delivery, you will work to push the baby out of your vagina. However, before you can push your baby out, a few things need to happen. The opening of your uterus (cervix) has to soften, thin out, and open up (dilate) all the way to 10 cm. Also, your baby has to move down from the uterus into your vagina.  °SIGNS OF LABOR  °Your health care provider will first need to make sure you  are in labor. Signs of labor include:  °· Passing what is called the mucous plug before labor begins. This is a small amount of blood-stained mucus. °· Having regular, painful uterine contractions.   °· The time between contractions gets shorter.   °· The discomfort and pain gradually get more intense. °· Contraction pains get worse when walking and do not go away when resting.   °· Your cervix becomes thinner (effacement) and dilates. °BEFORE THE DELIVERY °Once you are in labor and admitted into the hospital or care center, your health care provider may do the following:  °· Perform a complete physical exam. °· Review any complications related to pregnancy or labor.  °· Check your blood pressure, pulse, temperature, and heart rate (vital signs).   °· Determine if, and when, the rupture of amniotic membranes occurred. °· Do a vaginal exam (using a sterile glove and lubricant) to determine:   °¨ The position (presentation) of the baby. Is the baby's head presenting first (vertex) in the birth canal (vagina), or are the feet or buttocks first (breech)?   °¨ The level (station) of the baby's head within   the birth canal.   °¨ The effacement and dilatation of the cervix.   °· An electronic fetal monitor is usually placed on your abdomen when you first arrive. This is used to monitor your contractions and the baby's heart rate. °¨ When the monitor is on your abdomen (external fetal monitor), it can only pick up the frequency and length of your contractions. It cannot tell the strength of your contractions. °¨ If it becomes necessary for your health care provider to know exactly how strong your contractions are or to see exactly what the baby's heart rate is doing, an internal monitor may be inserted into your vagina and uterus. Your health care provider will discuss the benefits and risks of using an internal monitor and obtain your permission before inserting the device. °¨ Continuous fetal monitoring may be needed if  you have an epidural, are receiving certain medicines (such as oxytocin), or have pregnancy or labor complications. °· An IV access tube may be placed into a vein in your arm to deliver fluids and medicines if necessary. °THREE STAGES OF LABOR AND DELIVERY °Normal labor and delivery is divided into three stages. °First Stage °This stage starts when you begin to contract regularly and your cervix begins to efface and dilate. It ends when your cervix is completely open (fully dilated). The first stage is the longest stage of labor and can last from 3 hours to 15 hours.  °Several methods are available to help with labor pain. You and your health care provider will decide which option is best for you. Options include:  °· Opioid medicines. These are strong pain medicines that you can get through your IV tube or as a shot into your muscle. These medicines lessen pain but do not make it go away completely.  °· Epidural. A medicine is given through a thin tube that is inserted in your back. The medicine numbs the lower part of your body and prevents any pain in that area. °· Paracervical pain medicine. This is an injection of an anesthetic on each side of your cervix.   °· You may request natural childbirth, which does not involve the use of pain medicines or an epidural during labor and delivery. Instead, you will use other things, such as breathing exercises, to help cope with the pain. °Second Stage °The second stage of labor begins when your cervix is fully dilated at 10 cm. It continues until you push your baby down through the birth canal and the baby is born. This stage can take only minutes or several hours. °· The location of your baby's head as it moves through the birth canal is reported as a number called a station. If the baby's head has not started its descent, the station is described as being at minus 3 (-3). When your baby's head is at the zero station, it is at the middle of the birth canal and is engaged  in the pelvis. The station of your baby helps indicate the progress of the second stage of labor. °· When your baby is born, your health care provider may hold the baby with his or her head lowered to prevent amniotic fluid, mucus, and blood from getting into the baby's lungs. The baby's mouth and nose may be suctioned with a small bulb syringe to remove any additional fluid. °· Your health care provider may then place the baby on your stomach. It is important to keep the baby from getting cold. To do this, the health care provider will dry   the baby off, place the baby directly on your skin (with no blankets between you and the baby), and cover the baby with warm, dry blankets.   The umbilical cord is cut. Third Stage During the third stage of labor, your health care provider will deliver the placenta (afterbirth) and make sure your bleeding is under control. The delivery of the placenta usually takes about 5 minutes but can take up to 30 minutes. After the placenta is delivered, a medicine may be given either by IV or injection to help contract the uterus and control bleeding. If you are planning to breastfeed, you can try to do so now. After you deliver the placenta, your uterus should contract and get very firm. If your uterus does not remain firm, your health care provider will massage it. This is important because the contraction of the uterus helps cut off bleeding at the site where the placenta was attached to your uterus. If your uterus does not contract properly and stay firm, you may continue to bleed heavily. If there is a lot of bleeding, medicines may be given to contract the uterus and stop the bleeding.    This information is not intended to replace advice given to you by your health care provider. Make sure you discuss any questions you have with your health care provider.   Document Released: 02/01/2008 Document Revised: 05/15/2014 Document Reviewed: 12/20/2011 [employer] Interactive  Patient Education [employer] Third Trimester of Pregnancy The third trimester is from week 29 through week 42, months 7 through 9. The third trimester is a time when the fetus is growing rapidly. At the end of the ninth month, the fetus is about 20 inches in length and weighs 6-10 pounds.  BODY CHANGES Your body goes through many changes during pregnancy. The changes vary from woman to woman.   Your weight will continue to increase. You can expect to gain 25-35 pounds (11-16 kg) by the end of the pregnancy.  You may begin to get stretch marks on your hips, abdomen, and breasts.  You may urinate more often because the fetus is moving lower into your pelvis and pressing on your bladder.  You may develop or continue to have heartburn as a result of your pregnancy.  You may develop constipation because certain hormones are causing the muscles that push waste through your intestines to slow down.  You may develop hemorrhoids or swollen, bulging veins (varicose veins).  You may have pelvic pain because of the weight gain and pregnancy hormones relaxing your joints between the bones in your pelvis. Backaches may result from overexertion of the muscles supporting your posture.  You may have changes in your hair. These can include thickening of your hair, rapid growth, and changes in texture. Some women also have hair loss during or after pregnancy, or hair that feels dry or thin. Your hair will most likely return to normal after your baby is born.  Your breasts will continue to grow and be tender. A yellow discharge may leak from your breasts called colostrum.  Your belly button may stick out.  You may feel short of breath because of your expanding uterus.  You may notice the fetus "dropping," or moving lower in your abdomen.  You may have a bloody mucus discharge. This usually occurs a few days to a week before labor begins.  Your cervix becomes thin and soft (effaced) near your due  date. WHAT TO EXPECT AT YOUR PRENATAL EXAMS  You will have  prenatal exams every 2 weeks until week 36. Then, you will have weekly prenatal exams. During a routine prenatal visit:  You will be weighed to make sure you and the fetus are growing normally.  Your blood pressure is taken.  Your abdomen will be measured to track your baby's growth.  The fetal heartbeat will be listened to.  Any test results from the previous visit will be discussed.  You may have a cervical check near your due date to see if you have effaced. At around 36 weeks, your caregiver will check your cervix. At the same time, your caregiver will also perform a test on the secretions of the vaginal tissue. This test is to determine if a type of bacteria, Group B streptococcus, is present. Your caregiver will explain this further. Your caregiver may ask you:  What your birth plan is.  How you are feeling.  If you are feeling the baby move.  If you have had any abnormal symptoms, such as leaking fluid, bleeding, severe headaches, or abdominal cramping.  If you are using any tobacco products, including cigarettes, chewing tobacco, and electronic cigarettes.  If you have any questions. Other tests or screenings that may be performed during your third trimester include:  Blood tests that check for low iron levels (anemia).  Fetal testing to check the health, activity level, and growth of the fetus. Testing is done if you have certain medical conditions or if there are problems during the pregnancy.  HIV (human immunodeficiency virus) testing. If you are at high risk, you may be screened for HIV during your third trimester of pregnancy. FALSE LABOR You may feel small, irregular contractions that eventually go away. These are called Braxton Hicks contractions, or false labor. Contractions may last for hours, days, or even weeks before true labor sets in. If contractions come at regular intervals, intensify, or become  painful, it is best to be seen by your caregiver.  SIGNS OF LABOR   Menstrual-like cramps.  Contractions that are 5 minutes apart or less.  Contractions that start on the top of the uterus and spread down to the lower abdomen and back.  A sense of increased pelvic pressure or back pain.  A watery or bloody mucus discharge that comes from the vagina. If you have any of these signs before the 37th week of pregnancy, call your caregiver right away. You need to go to the hospital to get checked immediately. HOME CARE INSTRUCTIONS   Avoid all smoking, herbs, alcohol, and unprescribed drugs. These chemicals affect the formation and growth of the baby.  Do not use any tobacco products, including cigarettes, chewing tobacco, and electronic cigarettes. If you need help quitting, ask your health care provider. You may receive counseling support and other resources to help you quit.  Follow your caregiver's instructions regarding medicine use. There are medicines that are either safe or unsafe to take during pregnancy.  Exercise only as directed by your caregiver. Experiencing uterine cramps is a good sign to stop exercising.  Continue to eat regular, healthy meals.  Wear a good support bra for breast tenderness.  Do not use hot tubs, steam rooms, or saunas.  Wear your seat belt at all times when driving.  Avoid raw meat, uncooked cheese, cat litter boxes, and soil used by cats. These carry germs that can cause birth defects in the baby.  Take your prenatal vitamins.  Take 1500-2000 mg of calcium daily starting at the 20th week of pregnancy until you  deliver your baby.  Try taking a stool softener (if your caregiver approves) if you develop constipation. Eat more high-fiber foods, such as fresh vegetables or fruit and whole grains. Drink plenty of fluids to keep your urine clear or pale yellow.  Take warm sitz baths to soothe any pain or discomfort caused by hemorrhoids. Use hemorrhoid  cream if your caregiver approves.  If you develop varicose veins, wear support hose. Elevate your feet for 15 minutes, 3-4 times a day. Limit salt in your diet.  Avoid heavy lifting, wear low heal shoes, and practice good posture.  Rest a lot with your legs elevated if you have leg cramps or low back pain.  Visit your dentist if you have not gone during your pregnancy. Use a soft toothbrush to brush your teeth and be gentle when you floss.  A sexual relationship may be continued unless your caregiver directs you otherwise.  Do not travel far distances unless it is absolutely necessary and only with the approval of your caregiver.  Take prenatal classes to understand, practice, and ask questions about the labor and delivery.  Make a trial run to the hospital.  Pack your hospital bag.  Prepare the baby's nursery.  Continue to go to all your prenatal visits as directed by your caregiver. SEEK MEDICAL CARE IF:  You are unsure if you are in labor or if your water has broken.  You have dizziness.  You have mild pelvic cramps, pelvic pressure, or nagging pain in your abdominal area.  You have persistent nausea, vomiting, or diarrhea.  You have a bad smelling vaginal discharge.  You have pain with urination. SEEK IMMEDIATE MEDICAL CARE IF:   You have a fever.  You are leaking fluid from your vagina.  You have spotting or bleeding from your vagina.  You have severe abdominal cramping or pain.  You have rapid weight loss or gain.  You have shortness of breath with chest pain.  You notice sudden or extreme swelling of your face, hands, ankles, feet, or legs.  You have not felt your baby move in over an hour.  You have severe headaches that do not go away with medicine.  You have vision changes.   This information is not intended to replace advice given to you by your health care provider. Make sure you discuss any questions you have with your health care provider.     Document Released: 04/18/2001 Document Revised: 05/15/2014 Document Reviewed: 06/25/2012 [employer] Interactive Patient Education [employer]

## 2016-03-12 ENCOUNTER — Inpatient Hospital Stay (HOSPITAL_COMMUNITY)
Admission: AD | Admit: 2016-03-12 | Discharge: 2016-03-15 | DRG: 775 | Disposition: A | Discharge status: Home / Self Care | Payer: BLUE CROSS/BLUE SHIELD | Source: Ambulatory Visit | Attending: Obstetrics and Gynecology | Admitting: Obstetrics and Gynecology

## 2016-03-12 ENCOUNTER — Encounter (HOSPITAL_COMMUNITY): Payer: Self-pay

## 2016-03-12 ENCOUNTER — Inpatient Hospital Stay (HOSPITAL_COMMUNITY): Payer: BLUE CROSS/BLUE SHIELD | Admitting: Anesthesiology

## 2016-03-12 DIAGNOSIS — Z3403 Encounter for supervision of normal first pregnancy, third trimester: Secondary | ICD-10-CM | POA: Diagnosis present

## 2016-03-12 DIAGNOSIS — E669 Obesity, unspecified: Secondary | ICD-10-CM | POA: Diagnosis present

## 2016-03-12 DIAGNOSIS — Z6834 Body mass index (BMI) 34.0-34.9, adult: Secondary | ICD-10-CM | POA: Diagnosis not present

## 2016-03-12 DIAGNOSIS — Z8249 Family history of ischemic heart disease and other diseases of the circulatory system: Secondary | ICD-10-CM

## 2016-03-12 DIAGNOSIS — Z87891 Personal history of nicotine dependence: Secondary | ICD-10-CM | POA: Diagnosis not present

## 2016-03-12 DIAGNOSIS — Z3A40 40 weeks gestation of pregnancy: Secondary | ICD-10-CM | POA: Diagnosis not present

## 2016-03-12 DIAGNOSIS — O411230 Chorioamnionitis, third trimester, not applicable or unspecified: Secondary | ICD-10-CM | POA: Diagnosis present

## 2016-03-12 DIAGNOSIS — O99214 Obesity complicating childbirth: Secondary | ICD-10-CM | POA: Diagnosis present

## 2016-03-12 HISTORY — DX: Other specified health status: Z78.9

## 2016-03-12 LAB — CBC
HEMATOCRIT: 37.2 % (ref 36.0–46.0)
HEMOGLOBIN: 12.5 g/dL (ref 12.0–15.0)
MCH: 28.1 pg (ref 26.0–34.0)
MCHC: 33.6 g/dL (ref 30.0–36.0)
MCV: 83.6 fL (ref 78.0–100.0)
Platelets: 228 10*3/uL (ref 150–400)
RBC: 4.45 MIL/uL (ref 3.87–5.11)
RDW: 15.5 % (ref 11.5–15.5)
WBC: 9.8 10*3/uL (ref 4.0–10.5)

## 2016-03-12 LAB — TYPE AND SCREEN
ABO/RH(D): O POS
ANTIBODY SCREEN: NEGATIVE

## 2016-03-12 LAB — RPR: RPR Ser Ql: NONREACTIVE

## 2016-03-12 LAB — ABO/RH: ABO/RH(D): O POS

## 2016-03-12 MED ORDER — OXYCODONE-ACETAMINOPHEN 5-325 MG PO TABS: 1.0000 | ORAL_TABLET | ORAL | Status: DC | PRN

## 2016-03-12 MED ORDER — FENTANYL 2.5 MCG/ML BUPIVACAINE 1/10 % EPIDURAL INFUSION (WH - ANES)
14.0000 mL/h | INTRAMUSCULAR | Status: DC | PRN
  Administered 2016-03-12 (×2): 14 mL/h via EPIDURAL
  Filled 2016-03-12 (×2): qty 100

## 2016-03-12 MED ORDER — DIPHENHYDRAMINE HCL 50 MG/ML IJ SOLN: 12.5000 mg | INTRAMUSCULAR | Status: DC | PRN

## 2016-03-12 MED ORDER — LACTATED RINGERS IV SOLN
INTRAVENOUS | Status: DC
  Administered 2016-03-12 (×3): via INTRAVENOUS

## 2016-03-12 MED ORDER — LACTATED RINGERS IV SOLN: 500.0000 mL | INTRAVENOUS | Status: DC | PRN

## 2016-03-12 MED ORDER — OXYTOCIN BOLUS FROM INFUSION
500.0000 mL | Freq: Once | INTRAVENOUS | Status: AC
  Administered 2016-03-13: 500 mL via INTRAVENOUS

## 2016-03-12 MED ORDER — ONDANSETRON HCL 4 MG/2ML IJ SOLN: 4.0000 mg | Freq: Four times a day (QID) | INTRAMUSCULAR | Status: DC | PRN

## 2016-03-12 MED ORDER — LACTATED RINGERS IV SOLN: 500.0000 mL | Freq: Once | INTRAVENOUS | Status: DC

## 2016-03-12 MED ORDER — ACETAMINOPHEN 325 MG PO TABS: 650.0000 mg | ORAL_TABLET | ORAL | Status: DC | PRN

## 2016-03-12 MED ORDER — ACETAMINOPHEN 500 MG PO TABS
1000.0000 mg | ORAL_TABLET | Freq: Once | ORAL | Status: AC
  Administered 2016-03-12: 1000 mg via ORAL
  Filled 2016-03-12: qty 2

## 2016-03-12 MED ORDER — EPHEDRINE 5 MG/ML INJ
10.0000 mg | INTRAVENOUS | Status: DC | PRN
  Filled 2016-03-12: qty 4

## 2016-03-12 MED ORDER — SOD CITRATE-CITRIC ACID 500-334 MG/5ML PO SOLN: 30.0000 mL | ORAL | Status: DC | PRN

## 2016-03-12 MED ORDER — OXYCODONE-ACETAMINOPHEN 5-325 MG PO TABS: 2.0000 | ORAL_TABLET | ORAL | Status: DC | PRN

## 2016-03-12 MED ORDER — LIDOCAINE HCL (PF) 1 % IJ SOLN
30.0000 mL | INTRAMUSCULAR | Status: DC | PRN
Start: 2016-03-12 — End: 2016-03-13
  Filled 2016-03-12: qty 30

## 2016-03-12 MED ORDER — PHENYLEPHRINE 40 MCG/ML (10ML) SYRINGE FOR IV PUSH (FOR BLOOD PRESSURE SUPPORT)
80.0000 ug | PREFILLED_SYRINGE | INTRAVENOUS | Status: DC | PRN
  Filled 2016-03-12: qty 5
  Filled 2016-03-12: qty 10

## 2016-03-12 MED ORDER — LACTATED RINGERS IV SOLN
INTRAVENOUS | Status: DC
  Administered 2016-03-12 (×2): via INTRAUTERINE

## 2016-03-12 MED ORDER — LIDOCAINE HCL (PF) 1 % IJ SOLN
INTRAMUSCULAR | Status: DC | PRN
  Administered 2016-03-12: 5 mL via EPIDURAL
  Administered 2016-03-12: 3 mL via EPIDURAL
  Administered 2016-03-12: 2 mL via EPIDURAL

## 2016-03-12 MED ORDER — OXYTOCIN 40 UNITS IN LACTATED RINGERS INFUSION - SIMPLE MED: 1.0000 m[IU]/min | INTRAVENOUS | Status: DC

## 2016-03-12 MED ORDER — FENTANYL CITRATE (PF) 100 MCG/2ML IJ SOLN
50.0000 ug | INTRAMUSCULAR | Status: DC | PRN
  Administered 2016-03-12 (×2): 50 ug via INTRAVENOUS
  Filled 2016-03-12 (×2): qty 2

## 2016-03-12 MED ORDER — SODIUM CHLORIDE 0.9 % IV SOLN
3.0000 g | Freq: Four times a day (QID) | INTRAVENOUS | Status: AC
  Administered 2016-03-12 – 2016-03-13 (×4): 3 g via INTRAVENOUS
  Filled 2016-03-12 (×4): qty 3

## 2016-03-12 MED ORDER — OXYTOCIN 40 UNITS IN LACTATED RINGERS INFUSION - SIMPLE MED
1.0000 m[IU]/min | INTRAVENOUS | Status: DC
  Administered 2016-03-12: 1 m[IU]/min via INTRAVENOUS

## 2016-03-12 MED ORDER — OXYTOCIN 40 UNITS IN LACTATED RINGERS INFUSION - SIMPLE MED
2.5000 [IU]/h | INTRAVENOUS | Status: DC
  Filled 2016-03-12: qty 1000

## 2016-03-12 MED ORDER — TERBUTALINE SULFATE 1 MG/ML IJ SOLN
0.2500 mg | Freq: Once | INTRAMUSCULAR | Status: DC | PRN
  Filled 2016-03-12: qty 1

## 2016-03-12 MED ORDER — FENTANYL CITRATE (PF) 100 MCG/2ML IJ SOLN: 50.0000 ug | INTRAMUSCULAR | Status: DC | PRN

## 2016-03-12 NOTE — Anesthesia Preprocedure Evaluation (Signed)
Anesthesia Evaluation  Patient identified by MRN, date of birth, ID band Patient awake    Reviewed: Allergy & Precautions, NPO status , Patient's Chart, lab work & pertinent test results  Airway Mallampati: II  TM Distance: >3 FB Neck ROM: Full    Dental  (+) Teeth Intact, Dental Advisory Given   Pulmonary neg pulmonary ROS, former smoker,    Pulmonary exam normal breath sounds clear to auscultation       Cardiovascular negative cardio ROS Normal cardiovascular exam Rhythm:Regular Rate:Normal     Neuro/Psych negative neurological ROS     GI/Hepatic negative GI ROS, Neg liver ROS,   Endo/Other  Obesity   Renal/GU negative Renal ROS     Musculoskeletal negative musculoskeletal ROS (+)   Abdominal   Peds  Hematology negative hematology ROS (+) Plt 228k   Anesthesia Other Findings Day of surgery medications reviewed with the patient.  Reproductive/Obstetrics (+) Pregnancy                             Anesthesia Physical Anesthesia Plan  ASA: II  Anesthesia Plan: Epidural   Post-op Pain Management:    Induction:   Airway Management Planned:   Additional Equipment:   Intra-op Plan:   Post-operative Plan:   Informed Consent: I have reviewed the patients History and Physical, chart, labs and discussed the procedure including the risks, benefits and alternatives for the proposed anesthesia with the patient or authorized representative who has indicated his/her understanding and acceptance.   Dental advisory given  Plan Discussed with:   Anesthesia Plan Comments: (Patient identified. Risks/Benefits/Options discussed with patient including but not limited to bleeding, infection, nerve damage, paralysis, failed block, incomplete pain control, headache, blood pressure changes, nausea, vomiting, reactions to medication both or allergic, itching and postpartum back pain. Confirmed with  bedside nurse the patient's most recent platelet count. Confirmed with patient that they are not currently taking any anticoagulation, have any bleeding history or any family history of bleeding disorders. Patient expressed understanding and wished to proceed. All questions were answered. )        Anesthesia Quick Evaluation

## 2016-03-12 NOTE — Progress Notes (Signed)
Subjective: Pt comfortable with epidural.  Has peanut ball on right side.  Family in room.  Objective: BP (!) 99/58   Pulse (!) 104   Temp 97.9 F (36.6 C) (Oral)   Resp 16   Ht 5\' 3"  (1.6 m)   Wt 88.9 kg (196 lb 1.3 oz)   LMP 03/09/2015 (Approximate)   SpO2 100%   BMI 34.73 kg/m  No intake/output data recorded. No intake/output data recorded.  FHT: Category 1 UC:   regular, every 3 minutes SVE:   Dilation: 6 Effacement (%): 100 Station: 0 Exam by:: k fields, rn Pitocin started IUPC placed without difficulty.  Procedure explained before placement.  Assessment:  Pt is a G2P0010 with EDC of 03/11/2016 at 40.1 wks IUP in active labor Cat 1 strip Plan: Monitor progress Kenney HousemanNancy Jean Lakecia Deschamps CNM, MSN 03/12/2016, 5:54 PM

## 2016-03-12 NOTE — H&P (Signed)
Bonnie [name] is a 25 y.o. female, G2P0010 at 40.1 weeks, presenting for contractions.  Denies bleeding or leaking of fluid.  Fm+ Prenatal hx remarkable for echogenic foci noted on anatomy US.  There are no active problems to display for this patient.   History of present pregnancy: Patient entered care at 9 weeks.   EDC of 03/11/2016 was established by LMP.   Anatomy scan:  20 weeks, with normal findings  Additional US evaluations:    Not found Significant prenatal events:  UTI treated, toc negative   Last evaluation:  yesterday  OB History    Gravida Para Term Preterm AB Living   2       1     SAB TAB Ectopic Multiple Live Births     1           Past Medical History:  Diagnosis Date  . Hx of chlamydia infection   . Medical history non-contributory   . Urinary tract infection    hx of   Past Surgical History:  Procedure Laterality Date  . DILATION AND EVACUATION    . I&D EXTREMITY Left 07/18/2012   Procedure: LEFT IRRIGATION AND DEBRIDEMENT AND REPAIR OF THE INDEX FINGER NERVES AND  TENDON ;  Surgeon: Dominica SeverinWilliam Gramig, MD;  Location: [facility] OR;  Service: Orthopedics;  Laterality: Left;  . TONSILLECTOMY    . WISDOM TOOTH EXTRACTION     Family History: family history includes Hypertension in her maternal grandmother and mother; Varicose Veins in her maternal grandmother. Social History:  reports that she has quit smoking. Her smoking use included Cigars. She has a 0.50 pack-year smoking history. She has never used smokeless tobacco. She reports that she uses drugs, including Marijuana. She reports that she does not drink alcohol.   Prenatal Transfer Tool  Maternal Diabetes: No Genetic Screening: Normal Maternal Ultrasounds/Referrals: Declined Fetal Ultrasounds or other Referrals:  None Maternal Substance Abuse:  Yes:  Type: Marijuana Smoking, quit when found out pregnant Significant Maternal Medications:  None Significant Maternal Lab Results: None   ROS:  All 10  systems reviewed and negative expect with what stated above   No Known Allergies   Dilation: 4 Effacement (%): 90 Station: -2 Exam by:: Laurell Josephsheryl Anderson RN Blood pressure 126/65, pulse 95, temperature 98.1 F (36.7 C), temperature source Oral, resp. rate 16, height 5\' 3"  (1.6 m), weight 88.9 kg (196 lb 1.3 oz), last menstrual period 03/09/2015.  Chest clear Heart RRR without murmur Abd gravid, NT, FH appropriated Pelvic:see above Ext: Negative edema, +2/+2 reflexes  FHR: Category 1 UCs:  3 in 10 minutes  Prenatal labs: ABO, Rh: O/Positive/-- (03/27 0000) Antibody: Negative (03/27 0000) Rubella:  Immune RPR: Nonreactive (03/27 0000)  HBsAg: Negative (03/27 0000)  HIV: Non-reactive (03/27 0000)  GBS: Negative (10/19 0000) Sickle cell/Hgb electrophoresis:   Pap:  2016 neg GC:  Neg Chlamydia:  Neg Genetic screenings:  Neg Glucola:  98 Other:   Hgb 12.4       Assessment/Plan: IUP at 40.1 IUP in early active labor Cat 1 strip  Plan: Admit to Birthing Suite per consult with Sallye OberKulwa Routine [facility] orders Pain med/epidural prn   Henderson NewcomerNancy Jean ProtheroCNM, MSN 03/12/2016, 8:49 AM

## 2016-03-12 NOTE — Anesthesia Procedure Notes (Signed)
Epidural Patient location during procedure: OB  Staffing Anesthesiologist: TURK, STEPHEN EDWARD Performed: anesthesiologist   Preanesthetic Checklist Completed: patient identified, pre-op evaluation, timeout performed, IV checked, risks and benefits discussed and monitors and equipment checked  Epidural Patient position: sitting Prep: DuraPrep Patient monitoring: blood pressure and continuous pulse ox Approach: midline Location: L3-L4 Injection technique: LOR air  Needle:  Needle type: Tuohy  Needle gauge: 17 G Needle length: 9 cm Needle insertion depth: 5 cm Catheter size: 19 Gauge Catheter at skin depth: 10 cm Test dose: negative and Other (1% Lidocaine)  Additional Notes Patient identified.  Risk benefits discussed including failed block, incomplete pain control, headache, nerve damage, paralysis, blood pressure changes, nausea, vomiting, reactions to medication both toxic or allergic, and postpartum back pain.  Patient expressed understanding and wished to proceed.  All questions were answered.  Sterile technique used throughout procedure and epidural site dressed with sterile barrier dressing. No paresthesia or other complications noted. The patient did not experience any signs of intravascular injection such as tinnitus or metallic taste in mouth nor signs of intrathecal spread such as rapid motor block. Please see nursing notes for vital signs. Reason for block:procedure for pain     

## 2016-03-12 NOTE — MAU Note (Signed)
Called Bonnie PhoNancy [name] CNM patient G2P0 3284w1d -GBS, was 1 cm yesterday today 4/90/-2 intact, fhr reactive, contractions every 2 to 3, received admit orders.

## 2016-03-12 NOTE — Progress Notes (Signed)
Subjective: Pt is breathing through contractions. Requesting Nitrous. Support in room.  Objective: BP (!) 109/51   Pulse 78   Temp (P) 98.2 F (36.8 C) (Oral)   Resp (P) 16   Ht 5\' 3"  (1.6 m)   Wt 88.9 kg (196 lb 1.3 oz)   LMP 03/09/2015 (Approximate)   BMI 34.73 kg/m  No intake/output data recorded. No intake/output data recorded.  FHT: Category 1 UC:   regular, every 4-5 minutes SVE:   Dilation: 5 Effacement (%): 90 Station: 0 Exam by:: Jalina Blowers, cnm  BBOW  Assessment:  Pt is a G2P0010 at 40.1 IUP in active labor Cat 1 strip Plan: Discussed with pt option of AROM vs continuing with current plan.  Discussed normal progression.  Pt wishes to not have AROM at this point. Reassess labor progress. Dr. Sallye OberKulwa aware  Kenney HousemanNancy Jean Christian Borgerding CNM, MSN 03/12/2016, 12:12 PM

## 2016-03-12 NOTE — Anesthesia Pain Management Evaluation Note (Signed)
  CRNA Pain Management Visit Note  Patient: Bonnie [name], 25 y.o., female  "Hello I am a member of the anesthesia team at [facility]. We have an anesthesia team available at all times to provide care throughout the hospital, including epidural management and anesthesia for C-section. I don't know your plan for the delivery whether it a natural birth, water birth, IV sedation, nitrous supplementation, doula or epidural, but we want to meet your pain goals."   1.Was your pain managed to your expectations on prior hospitalizations?   No prior hospitalizations  2.What is your expectation for pain management during this hospitalization?     Nitrous Oxide  3.How can we help you reach that goal? Support prn  Record the patient's initial score and the patient's pain goal.   Pain: 7  Pain Goal: 9 [facility] wants you to be able to say your pain was always managed very well.  Crotched Mountain Rehabilitation CenterWRINKLE,Jannine Abreu 03/12/2016

## 2016-03-12 NOTE — MAU Note (Signed)
Patient presents with "extreme" contractions since 5:00 pm yesterday, patient tearful, taken directly to room 7 in MAU

## 2016-03-12 NOTE — Progress Notes (Signed)
Subjective: Pt had one dose of IV pain medication. Requesting another dose.  Breathing with contractions.  Support in room. Objective: BP (!) 125/56   Pulse 97   Temp 98 F (36.7 C) (Oral)   Resp 16   Ht 5\' 3"  (1.6 m)   Wt 88.9 kg (196 lb 1.3 oz)   LMP 03/09/2015 (Approximate)   BMI 34.73 kg/m  No intake/output data recorded. No intake/output data recorded.  FHT: Category 1 UC:   regular, every 3 minutes SVE:   Dilation: 6 Effacement (%): 100 Station: 0 Exam by:: Cohl Behrens, cnm AROM clear fluid  Assessment:  Pt is a G2P0010 at 40.1 IUP active labor Cat 1 strip Plan: Monitor progress  Kenney HousemanNancy Jean Raphael Espe CNM, MSN 03/12/2016, 2:32 PM

## 2016-03-13 ENCOUNTER — Encounter (HOSPITAL_COMMUNITY): Payer: Self-pay

## 2016-03-13 MED ORDER — DOCUSATE SODIUM 100 MG PO CAPS: 100.0000 mg | ORAL_CAPSULE | Freq: Two times a day (BID) | ORAL | Status: DC

## 2016-03-13 MED ORDER — ONDANSETRON HCL 4 MG/2ML IJ SOLN: 4.0000 mg | INTRAMUSCULAR | Status: DC | PRN

## 2016-03-13 MED ORDER — PRENATAL MULTIVITAMIN CH
1.0000 | ORAL_TABLET | Freq: Every day | ORAL | Status: DC
  Administered 2016-03-13 – 2016-03-15 (×3): 1 via ORAL
  Filled 2016-03-13 (×3): qty 1

## 2016-03-13 MED ORDER — COCONUT OIL OIL
1.0000 "application " | TOPICAL_OIL | Status: DC | PRN
  Administered 2016-03-15: 1 via TOPICAL
  Filled 2016-03-13: qty 120

## 2016-03-13 MED ORDER — OXYCODONE-ACETAMINOPHEN 5-325 MG PO TABS: 1.0000 | ORAL_TABLET | ORAL | Status: DC | PRN

## 2016-03-13 MED ORDER — DIBUCAINE 1 % RE OINT
1.0000 "application " | TOPICAL_OINTMENT | RECTAL | Status: DC | PRN
  Administered 2016-03-15: 1 via RECTAL
  Filled 2016-03-13: qty 28

## 2016-03-13 MED ORDER — ONDANSETRON HCL 4 MG PO TABS: 4.0000 mg | ORAL_TABLET | ORAL | Status: DC | PRN

## 2016-03-13 MED ORDER — ZOLPIDEM TARTRATE 5 MG PO TABS: 5.0000 mg | ORAL_TABLET | Freq: Every evening | ORAL | Status: DC | PRN

## 2016-03-13 MED ORDER — OXYCODONE-ACETAMINOPHEN 5-325 MG PO TABS: 2.0000 | ORAL_TABLET | ORAL | Status: DC | PRN

## 2016-03-13 MED ORDER — SENNOSIDES-DOCUSATE SODIUM 8.6-50 MG PO TABS
2.0000 | ORAL_TABLET | ORAL | Status: DC
  Administered 2016-03-13 – 2016-03-14 (×2): 2 via ORAL
  Filled 2016-03-13 (×2): qty 2

## 2016-03-13 MED ORDER — ACETAMINOPHEN 325 MG PO TABS
650.0000 mg | ORAL_TABLET | ORAL | Status: DC | PRN
  Administered 2016-03-13: 650 mg via ORAL
  Filled 2016-03-13: qty 2

## 2016-03-13 MED ORDER — WITCH HAZEL-GLYCERIN EX PADS
1.0000 "application " | MEDICATED_PAD | CUTANEOUS | Status: DC | PRN
  Administered 2016-03-15: 1 via TOPICAL

## 2016-03-13 MED ORDER — BENZOCAINE-MENTHOL 20-0.5 % EX AERO
1.0000 "application " | INHALATION_SPRAY | CUTANEOUS | Status: DC | PRN
  Administered 2016-03-15: 1 via TOPICAL
  Filled 2016-03-13: qty 56

## 2016-03-13 MED ORDER — IBUPROFEN 600 MG PO TABS
600.0000 mg | ORAL_TABLET | Freq: Four times a day (QID) | ORAL | Status: DC
  Administered 2016-03-13 – 2016-03-15 (×9): 600 mg via ORAL
  Filled 2016-03-13 (×9): qty 1

## 2016-03-13 MED ORDER — FERROUS SULFATE 325 (65 FE) MG PO TABS
325.0000 mg | ORAL_TABLET | Freq: Two times a day (BID) | ORAL | Status: DC
Start: 2016-03-13 — End: 2016-03-13

## 2016-03-13 MED ORDER — DIPHENHYDRAMINE HCL 25 MG PO CAPS: 25.0000 mg | ORAL_CAPSULE | Freq: Four times a day (QID) | ORAL | Status: DC | PRN

## 2016-03-13 MED ORDER — SIMETHICONE 80 MG PO CHEW: 80.0000 mg | CHEWABLE_TABLET | ORAL | Status: DC | PRN

## 2016-03-13 NOTE — Lactation Note (Addendum)
This note was copied from a baby's chart. Lactation Consultation Note  Patient Name: Boy Jeannine Bogaorsha Cronce UJWJX'BToday's Date: 03/13/2016 Reason for consult: Initial assessment  Visited with first time Mom, baby 2312 hrs old.  5651w2d, baby weighing 8 lbs 4.5 oz. Code Apgar due to shoulder dystocia.  Baby hasn't latched yet, though tried several times during the night.  Mom holding baby STS.  Offered to assist with positioning and latching baby.  Baby sleepy presently, so positioned baby in football hold.  Breasts large, heavy, with flat nipples and fibrous non-compressible areola.   Demonstrated breast massage, and hand expression to Mom.  A small glistening of colostrum on nipple noted.  Due to baby being sleepy, and showing no interest in feeding, initiated the DEBP (with instructions on use, and care of pump parts) and assisted Mom to double pump.  Right breast flange changed to a 21 mm.  Increased pump strength but unable to move the nipple with pumping.  No EBM expressed at present. Introduced breast shells and instructed on use and care.  Educated Mom on need to supplement baby with EBM+/formula due to the difficult latch.  Mom to call when baby starts cueing to feed.  Talked about nipple shields, but with her areola being so hard to compress at present, a nipple shield would not help.  Mom aware and agreeable with plan to call.  Encouraged STS, and feeding often on cue.  If no latch obtained, supplementation needed.   Brochure left in room, and Mom informed of IP and OP Lactation services available to her.  Mom to call for help at next feeding.   Consult Status Consult Status: Follow-up Date: 03/14/16 Follow-up type: In-patient    Judee ClaraSmith, Precious Gilchrest E 03/13/2016, 1:25 PM

## 2016-03-13 NOTE — Progress Notes (Signed)
  Subjective: Excruciating upper back pain with position changes. Anesthesia evaluated pt - epidural found to be intact and working well. Pt o/w comfortable w/ epidural.  Objective: BP (!) 115/46   Pulse 91   Temp 99.4 F (37.4 C) (Oral)   Resp 18   Ht 5\' 3"  (1.6 m)   Wt 88.9 kg (196 lb 1.3 oz)   LMP 03/09/2015 (Approximate)   SpO2 98%   BMI 34.73 kg/m   Oral Temp 101.3 at 2130 FHT: Category 1 now - previous FHR of 160 w/ earlys, mild-mod variables, occ late. UC:   irregular, every 1-2 minutes, MVUs 200s starting at 20:30 PM SVE: C/C/+1 @ 00:19 Pitocin at 9 mU/min Amnioinfusion since 21:50   Of note, baby w/ echogenic focus of left ventricle  Assessment:  25 yo G2P0010 @ 40.2 wks 2nd stage labor; adequate MVUs AROM x 7 hrs before first temp Suspected Triple 1 GBS  Neg  Plan: Received 1 gram of Tylenol, 500 ml LR bolus, cooling blanket and started on IV Unasyn. Will evaluate to see if Tylenol effective for upper back pain. Increase Pitocin to 2x2. Cooling blanket. Dr. Normand Sloopillard updated.  Sherre ScarletWILLIAMS, Lorina Duffner CNM 03/12/16, 12:39 AM

## 2016-03-13 NOTE — Progress Notes (Signed)
Post Partum Day 0 SVD Subjective: no complaints, up ad lib, voiding and tolerating PO  Objective: Blood pressure 108/75, pulse 86, temperature 97.7 F (36.5 C), temperature source Oral, resp. rate 18, height 5\' 3"  (1.6 m), weight 88.9 kg (196 lb 1.3 oz), last menstrual period 03/09/2015, SpO2 99 %, unknown if currently breastfeeding.  Physical Exam:  General: alert and cooperative Lochia: appropriate Uterine Fundus: firm Incision: NA DVT Evaluation: No evidence of DVT seen on physical exam.   Recent Labs  03/12/16 0745  HGB 12.5  HCT 37.2    Assessment/Plan: Patient doing well s/p SVD.  Routine postpartum care.  Pt planning on outpatient circumcision of infant  Chorioamnionitis. - continue unasyn for 24 hours post delivery.    LOS: 1 day   Maksym Pfiffner J. 03/13/2016, 7:07 PM

## 2016-03-13 NOTE — Anesthesia Postprocedure Evaluation (Signed)
Anesthesia Post Note  Patient: Bonnie [name]  Procedure(s) Performed: * No procedures listed *  Patient location during evaluation: Mother Baby Anesthesia Type: Epidural Level of consciousness: awake and alert and oriented Pain management: satisfactory to patient Vital Signs Assessment: post-procedure vital signs reviewed and stable Respiratory status: spontaneous breathing and nonlabored ventilation Cardiovascular status: stable Postop Assessment: no headache, no backache, no signs of nausea or vomiting, adequate PO intake and patient able to bend at knees (patient up walking) Anesthetic complications: no     Last Vitals:  Vitals:   03/13/16 0315 03/13/16 0404  BP: (!) 116/50 123/64  Pulse: 74 82  Resp: 18 18  Temp: 36.7 C 36.7 C    Last Pain:  Vitals:   03/13/16 0404  TempSrc: Oral  PainSc:    Pain Goal: Patients Stated Pain Goal: 3 (03/12/16 0725)               Madison HickmanGREGORY,Brnadon Eoff

## 2016-03-13 NOTE — Progress Notes (Signed)
Received patient with newborn from labor and delivery via wheelchair to room 129.  Pt. In stable condition, pain management discussed, oriented to room and call light.  Instructed to call RN for assist with ambulation to void, pt. Verbalized understanding.

## 2016-03-13 NOTE — Lactation Note (Signed)
This note was copied from a baby's chart. Lactation Consultation Note  Patient Name: Bonnie [name] Reason for consult: Follow-up assessment Baby at 20 hr of life. Called to room to help mom latch baby with NS. By the time lactation arrived baby was done bf and mom was pumping. Helped mom position the flanges on the breast. Discussed hands on pumping. Encouraged mom to call at next feeding for lactation to size the NS. Mom has large pendulous breast with flat nipples and non compressible areolas. Mom is aware of lactation services and support group.   Maternal Data    Feeding Feeding Type: Breast Fed Length of feed: 30 min (only latched for a couple sucks, minute colostrum expressed))  LATCH Score/Interventions Latch: Repeated attempts needed to sustain latch, nipple held in mouth throughout feeding, stimulation needed to elicit sucking reflex. Intervention(s): Skin to skin;Teach feeding cues;Waking techniques Intervention(s): Adjust position;Assist with latch;Breast massage;Breast compression  Audible Swallowing: A few with stimulation Intervention(s): Skin to skin;Hand expression Intervention(s): Skin to skin;Alternate breast massage  Type of Nipple: Flat Intervention(s): Double electric pump  Comfort (Breast/Nipple): Soft / non-tender     Hold (Positioning): Assistance needed to correctly position infant at breast and maintain latch.  LATCH Score: 6  Lactation Tools Discussed/Used Tools: Pump Breast pump type: Double-Electric Breast Pump   Consult Status Consult Status: Follow-up Date: 03/13/16 Follow-up type: In-patient    Bonnie [name] [name], 9:45 PM

## 2016-03-14 LAB — CBC
HCT: 33.7 % — ABNORMAL LOW (ref 36.0–46.0)
HEMOGLOBIN: 11.2 g/dL — AB (ref 12.0–15.0)
MCH: 27.9 pg (ref 26.0–34.0)
MCHC: 33.2 g/dL (ref 30.0–36.0)
MCV: 84 fL (ref 78.0–100.0)
Platelets: 176 10*3/uL (ref 150–400)
RBC: 4.01 MIL/uL (ref 3.87–5.11)
RDW: 15.7 % — ABNORMAL HIGH (ref 11.5–15.5)
WBC: 11 10*3/uL — AB (ref 4.0–10.5)

## 2016-03-14 NOTE — Progress Notes (Signed)
Post Partum Day 1 s/p SVD  Subjective: up ad lib, voiding, tolerating PO and + flatus  Objective: Blood pressure 122/66, pulse 80, temperature 97.7 F (36.5 C), temperature source Oral, resp. rate 18, height 5\' 3"  (1.6 m), weight 88.9 kg (196 lb 1.3 oz), last menstrual period 03/09/2015, SpO2 99 %, unknown if currently breastfeeding.  Physical Exam:  General: alert and cooperative Lochia: appropriate Uterine Fundus: firm Incision: NA DVT Evaluation: No evidence of DVT seen on physical exam.   Recent Labs  03/12/16 0745 03/14/16 0622  HGB 12.5 11.2*  HCT 37.2 33.7*    Assessment/Plan: Plan for discharge tomorrow, Breastfeeding and Lactation consult   Patient planning outpatient circumcision    LOS: 2 days   Bonnie [name] J. 03/14/2016, 7:26 AM

## 2016-03-14 NOTE — Lactation Note (Signed)
This note was copied from a baby's chart. Lactation Consultation Note  Patient Name: Bonnie [name] Reason for consult: Follow-up assessment Baby at 22 hr of life. Mom called for help latching baby. Because mom has large breast and flat nipples it is hard for her to support the baby and the breast. Baby can latch if some one holds the breast in tea cup position for him, but mom desires to bf on her own. Applied #20 NS and mom was able to latch baby comfortably unassisted. She will offer breast on demand 8+/24hr and post pump. She will call for lactation as needed.    Maternal Data    Feeding Feeding Type: Breast Fed Length of feed: 30 min (only latched for a couple sucks, minute colostrum expressed))  LATCH Score/Interventions Latch: Grasps breast easily, tongue down, lips flanged, rhythmical sucking. Intervention(s): Skin to skin;Teach feeding cues;Waking techniques Intervention(s): Adjust position;Assist with latch;Breast massage;Breast compression  Audible Swallowing: A few with stimulation Intervention(s): Skin to skin;Hand expression Intervention(s): Skin to skin;Alternate breast massage  Type of Nipple: Flat Intervention(s): Double electric pump  Comfort (Breast/Nipple): Soft / non-tender     Hold (Positioning): Full assist, staff holds infant at breast Intervention(s): Support Pillows;Position options  LATCH Score: 6  Lactation Tools Discussed/Used Tools: Nipple Shields Nipple shield size: 20 Breast pump type: Double-Electric Breast Pump   Consult Status Consult Status: Follow-up Date: 03/15/16 Follow-up type: In-patient    Bonnie [name] [name], 12:01 AM
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
## 2016-03-14 NOTE — Lactation Note (Signed)
This note was copied from a baby's chart. Lactation Consultation Note New mom having difficulty latching baby. Mom has very large pendulum breast, edema to breast.areola non-compressible. Reverse pressure w/slight improvement. Mom has very small nipple. Hand pump given w/#21 flange, pre-pump to evert nipple to apply #16 NS. Taught application. Difficult for mom to apply to large breast, nipple at the bottom of breast, cant see nipple. Latched baby in football hold. Baby BF well for 15 min. Noted colostrum pooled in NS.  Noted baby jittery, mom stated baby hasn't been that jittery. Notified RN. Reported to Rn of consult.  Mom needed constant hands on and a lot of teaching during consult.  Patient Name: Bonnie Jeannine Bogaorsha Walla WUJWJ'XToday's Date: 03/14/2016 Reason for consult: Follow-up assessment;Difficult latch   Maternal Data    Feeding Feeding Type: Breast Fed Length of feed: 15 min  LATCH Score/Interventions Latch: Repeated attempts needed to sustain latch, nipple held in mouth throughout feeding, stimulation needed to elicit sucking reflex. Intervention(s): Skin to skin;Teach feeding cues;Waking techniques Intervention(s): Adjust position;Assist with latch;Breast massage;Breast compression  Audible Swallowing: A few with stimulation Intervention(s): Skin to skin;Hand expression Intervention(s): Alternate breast massage  Type of Nipple: Flat Intervention(s): Shells;Hand pump;Double electric pump  Comfort (Breast/Nipple): Soft / non-tender     Hold (Positioning): Full assist, staff holds infant at breast Intervention(s): Breastfeeding basics reviewed;Support Pillows;Position options;Skin to skin  LATCH Score: 5  Lactation Tools Discussed/Used Tools: Shells;Nipple Shields;Pump;Flanges Nipple shield size: 16 Flange Size: Other (comment) (21) Shell Type: Inverted Breast pump type: Manual Pump Review: Setup, frequency, and cleaning;Milk Storage Initiated by:: Peri JeffersonL. Kerry Odonohue RN  IBCLC Date initiated:: 03/14/16   Consult Status Consult Status: Follow-up Date: 03/14/16 Follow-up type: In-patient    Bonnie DancerCARVER, Bonnie [name] 03/14/2016, 3:18 AM

## 2016-03-15 MED ORDER — INFLUENZA VAC SPLIT QUAD 0.5 ML IM SUSY
0.5000 mL | PREFILLED_SYRINGE | INTRAMUSCULAR | Status: AC
  Administered 2016-03-15: 0.5 mL via INTRAMUSCULAR
  Filled 2016-03-15: qty 0.5

## 2016-03-15 MED ORDER — INFLUENZA VAC SPLIT QUAD 0.5 ML IM SUSY: 0.5000 mL | PREFILLED_SYRINGE | INTRAMUSCULAR | Status: DC

## 2016-03-15 MED ORDER — IBUPROFEN 600 MG PO TABS: 600.0000 mg | ORAL_TABLET | Freq: Four times a day (QID) | ORAL | 1 refills | Status: DC | PRN

## 2016-03-15 NOTE — Discharge Summary (Signed)
OB Discharge Summary     Patient Name: Bonnie [name] A Pannone DOB: 1991-02-21 MRN: [id number]  Date of admission: 03/12/2016 Delivering MD: Sherre ScarletWILLIAMS, KIMBERLY   Date of discharge: 03/15/2016  Admitting diagnosis: 40WKS CTX Intrauterine pregnancy: 3624w2d     Secondary diagnosis:  Active Problems:   Maternal fever during labor, delivered   Vaginal delivery   First degree perineal laceration during delivery  Additional problems: none     Discharge diagnosis: Term Pregnancy Delivered                                                                                                Post partum procedures:None  Augmentation: Pitocin  Complications: None  Hospital course:  Onset of Labor With Vaginal Delivery     25 y.o. yo G2P1011 at 3824w2d was admitted in Active Labor on 03/12/2016. Patient had an uncomplicated labor course as follows:  Membrane Rupture Time/Date: 2:27 PM ,03/12/2016   Intrapartum Procedures: Episiotomy: None [1]                                         Lacerations:  1st degree [2]  Patient had a delivery of a Viable infant. 03/13/2016  Information for the patient's newborn:  Bonnie [name], Boy Allona [[id number]][[id number]]  Delivery Method: Vaginal, Spontaneous Delivery (Filed from Delivery Summary)    Pateint had an uncomplicated postpartum course.  She is ambulating, tolerating a regular diet, passing flatus, and urinating well. Patient is discharged home in stable condition on 03/15/16.    Physical exam Vitals:   03/13/16 2047 03/14/16 0601 03/14/16 1700 03/15/16 0544  BP: 123/66 122/66 (!) 119/51 (!) 117/52  Pulse: 77 80 76 64  Resp: 18 18 18 18   Temp: 97.6 F (36.4 C) 97.7 F (36.5 C) 98.3 F (36.8 C) 98.2 F (36.8 C)  TempSrc: Oral Oral Oral Oral  SpO2: 99% 99%    Weight:      Height:       General: alert, cooperative and no distress Lochia: appropriate Uterine Fundus: firm Incision: N/A DVT Evaluation: No evidence of DVT seen on physical exam. Labs: Lab  Results  Component Value Date   WBC 11.0 (H) 03/14/2016   HGB 11.2 (L) 03/14/2016   HCT 33.7 (L) 03/14/2016   MCV 84.0 03/14/2016   PLT 176 03/14/2016   CMP Latest Ref Rng & Units 01/07/2014  Glucose 70 - 99 mg/dL 782(N102(H)  BUN 6 - 23 mg/dL 7  Creatinine 5.620.50 - 1.301.10 mg/dL 8.650.83  Sodium 784137 - 696147 mEq/L 136(L)  Potassium 3.7 - 5.3 mEq/L 3.6(L)  Chloride 96 - 112 mEq/L 99  CO2 19 - 32 mEq/L 23  Calcium 8.4 - 10.5 mg/dL 9.3  Total Protein 6.0 - 8.3 g/dL 7.8  Total Bilirubin 0.3 - 1.2 mg/dL 1.0  Alkaline Phos 39 - 117 U/L 74  AST 0 - 37 U/L 12  ALT 0 - 35 U/L 9    Discharge instruction: per After Visit Summary and "Baby and Me Booklet".  After  visit meds:    Medication List    STOP taking these medications   diphenhydramine-acetaminophen 25-500 MG Tabs tablet Commonly known as:  TYLENOL PM     TAKE these medications   ibuprofen 600 MG tablet Commonly known as:  ADVIL,MOTRIN Take 1 tablet (600 mg total) by mouth every 6 (six) hours as needed.   prenatal multivitamin Tabs tablet Take 1 tablet by mouth daily.       Diet: routine diet  Activity: Advance as tolerated. Pelvic rest for 6 weeks.   Outpatient follow up:6 weeks Follow up Appt:No future appointments. Follow up Visit:No Follow-up on file.  Postpartum contraception: Nexplanon  Newborn Data: Live born female  Birth Weight: 8 lb 4.5 oz (3755 g) APGAR: 2, 5  Baby Feeding: Breast Disposition:home with mother   03/15/2016 Jessee AversOLE,Sayward Horvath J., MD

## 2016-03-17 ENCOUNTER — Inpatient Hospital Stay (HOSPITAL_COMMUNITY): Admission: RE | Admit: 2016-03-17 | Payer: Commercial Managed Care - HMO | Source: Ambulatory Visit

## 2016-03-23 ENCOUNTER — Ambulatory Visit (HOSPITAL_COMMUNITY)
Admission: RE | Admit: 2016-03-23 | Discharge: 2016-03-23 | Disposition: A | Discharge status: Home / Self Care | Payer: Commercial Managed Care - HMO | Source: Ambulatory Visit | Attending: Obstetrics & Gynecology | Admitting: Obstetrics & Gynecology

## 2016-03-23 NOTE — Lactation Note (Signed)
Lactation Consult; Weight today 3690 g 8 # 1.2 oz Assisted mom with latching baby to the breast. Pleased he has done so well. Reviewed frequent nursing or pumping to promote a good milk supply. Encouragement given. No further questions at present. To call prn. Another OP appointment made for 04/06/16 at 2:30 pm.   Mother's reason for visit:  Needs help with breast feeding Visit Type:  Feeding assist Appointment Notes:  Has been pumping and bottle feeding baby. Mom has large breasts and flat nipples and had a hard time getting him latched to breast while here in hospital Consult:  Initial Lactation Consultant:  Pamelia HoitWeeks, Marketa Midkiff D  ________________________________________________________________________  Baby's Name:  Bonnie [name] Date of Birth:  03/13/2014 Pediatrician:   Cardell PeachGay Gender:  female Gestational Age: 7221w2d (At Birth) Birth Weight:  8 lb 4.5 oz (3755 g) Weight at Discharge:  Weight: 7 lb 11.8 oz (3510 g)             Date of Discharge:  03/15/2016      Filed Weights   03/13/16 0106 03/14/16 0030 03/15/16 0040  Weight: 8 lb 4.5 oz (3755 g) 7 lb 15 oz (3600 g) 7 lb 11.8 oz (3510 g)     ________________________________________________________________________  Mother's Name: Bonnie [name] Breastfeeding Experience:  P1  ________________________________________________________________________  Breastfeeding History (Post Discharge)  Frequency of breastfeeding:  Has not been putting the baby to the breast. Has been only pumping and bottle feeding EBM and formula   Supplementation  Formula:  Volume 2 oz Frequency:  As needed Total volume per day:   About 4 oz      Breastmilk:  Volume 3 oz Frequency:  q 2-3 hours Method:  Bottle,   Pumping  Frequency:   1-3 times/dayy Volume:  4-5 oz Infant Intake and Output Assessment  Voids:  8+ in 24 hrs.  Color:  Clear yellow had void and stool while her for appointment Stools:  8+ in 24 hrs.  Color:   Brown  ________________________________________________________________________  Maternal Breast Assessment  Breast:  Full _______________________________________________________________________ Feeding Assessment/Evaluation  Initial feeding assessment:  Positioning:  Football Left breast  LATCH documentation:  Latch:  2 = Grasps breast easily, tongue down, lips flanged, rhythmical sucking.  Audible swallowing:  2 = Spontaneous and intermittent  Type of nipple:  1 = Flat, more erect now per mom. Erect with stimulation. Areola compressible.  Comfort (Breast/Nipple):  2 = Soft / non-tender  Hold (Positioning):  1 = Assistance needed to correctly position infant at breast and maintain latch  LATCH score:  8  Attached assessment:  Deep  Lips flanged:  Yes.    Lips untucked:  Yes.    Suck assessment:  Nutritive   Pre-feed weight: 3690  g 8 3 1.2 oz Post-feed weight:  3728 g 8 # 3.5 oz Amount transferred:  38 ml  Lisbeth PlyJulius took a few attempts then latched well and nursed for 15 min. Mom needed much assist with positioning baby and getting him deep onto the breast. Mom has not pumped since 11 pm last night. Breasts are full but not engorged. Baby came off the breast choking once. Then latched back on and continued nursing. Used football hold. Mom very pleased that he has done to well.Mom reports her nipples are more erect since she has been pumping.      Pre-feed weight:  3716 g  8 # 3.1 oz after diaper change Post-feed weight:  3752 g  8# 4.5 oz Amount transferred:  35 ml  Julius latched well with assist after a couple of attempts. Nursed for 15 min then going off to sleep. Mom pleased he has done so well. Encouraged to always breast feed first. If he gets too frantic, to bottle feed a small amount to calm him and then try to latch him. Discussed that her milk supply may not be enough for him to only breast feed since she has not been pumping consistently  May still need to  supplement with EBM/formula. Encouraged to watch for feeding cues. [facility] to come to house next week for weight check. Offered another visit with us and mom wants to schedule it in 2 Alfred Harrel. Appointment made for 11/30 at 2:30 pm   Total amount pumped post feed: did not pump since he nursed on both breasts and mom reports they feel softer  Total amount transferred:  73 ml Total supplement given:  0 ml

## 2016-04-06 ENCOUNTER — Ambulatory Visit (HOSPITAL_COMMUNITY)
Admission: RE | Admit: 2016-04-06 | Discharge: 2016-04-06 | Disposition: A | Discharge status: Home / Self Care | Payer: BLUE CROSS/BLUE SHIELD | Source: Ambulatory Visit | Attending: Obstetrics and Gynecology | Admitting: Obstetrics and Gynecology

## 2016-04-06 NOTE — Lactation Note (Signed)
Lactation Consult: Weight today  4094 g  9# 0.4 oz Mom reports she is having trouble getting Bonnie to latch to breast. Reports he gets very fussy and will not latch so she bottle feeds him EBM,. Is only giving EBM, no formula. Reports she is pumping 4 times/day. Suggested pumping q 3 hours to promote milk supply. Encouraged to watch for feeding cues and try to latch him before he gets very hungry and fussy, Can try feeding him a small amount from bottle to calm him if needed then try to latch him. Mom pleased he had latched. Encouragement given. No questions at present. To call if wants another OP appointment.   Mother's reason for visit:  Follow up to help with latch Visit Type:  Feeding assist Appointment Notes:  FN Consult:  Follow-Up Lactation Consultant:  Pamelia HoitWeeks, Levante Simones D  ________________________________________________________________________  Bonnie FloresBaby's Name:  Bonnie BoardsJulius Allan [name] Date of Birth:  03/13/2014 Pediatrician:  Cardell PeachGay Gender:  female Gestational Age: 8030w2d (At Birth) Birth Weight:  8 lb 4.5 oz (3755 g) Weight at Discharge:  Weight: 7 lb 11.8 oz (3510 g)             Date of Discharge:  03/15/2016           ________________________________________________________________________  Mother's Name: Bonnie SaugerPorsha A [name]  Breastfeeding Experience:  P1 ________________________________________________________________________  Breastfeeding History (Post Discharge)  Frequency of breastfeeding:  Once/day Duration of feeding:  Having trouble getting him latched. He gets too fussy  Supplementation  Formula:  Volume 0 ml  Breastmilk:  Volume 6 oz Frequency:  q 3-4 hours  Method:  Bottle,   Pumping  Type of pump:  Lansinol Frequency:  4 times yesterday once so far today Volume:  5-6 oz    Infant Intake and Output Assessment  Voids:  10+ in 24 hrs.  Color:  Clear yellow Stools:  10+ in 24 hrs.  Color:   Yellow  ________________________________________________________________________  Maternal Breast Assessment  Breast:  Filling Nipple:  Flat  _______________________________________________________________________ Feeding Assessment/Evaluation  Initial feeding assessment:    Positioning:  Football Right breast  LATCH documentation:  Latch:  1 = Repeated attempts needed to sustain latch, nipple held in mouth throughout feeding, stimulation needed to elicit sucking reflex.  Audible swallowing:  2 = Spontaneous and intermittent  Type of nipple:  1 = Flat  Comfort (Breast/Nipple):  2 = Soft / non-tender  Hold (Positioning):  1 = Assistance needed to correctly position infant at breast and maintain latch  LATCH score:  7  Attached assessment:  Deep  Lips flanged:  Yes.    Lips untucked:  No.  Suck assessment:  Nutritive    Pre-feed weight:  4094 g 9# 0.8 oz Post-feed weight:  4154 g  9 # 2.5 oz Amount transferred:  58 ml Amount supplemented:  30 ml to calm him  Mom needs much assist with getting [name] latched to breast. He finally latched well and nursed for 10 min. Mom reports breast feels softer     Pre-feed weight:  4154 g  9 # 2.5 oz Post-feed weight:  4212 g 9 # 4.6 oz Amount transferred:  58 ml Amount supplemented:  30 ml  Lisbeth PlyJulius took several attempts then latched well. Nursed for 10 min. Still fussy after weight check so mom fed him another oz of EBM by bottle   Total amount pumped post feed:  Mom did not bring pump with her. Bonnie nursed on both breasts and mom reports they feel softer  Total amount transferred:  116 ml Total supplement given:  60 ml

## 2016-05-11 DIAGNOSIS — Z308 Encounter for other contraceptive management: Secondary | ICD-10-CM | POA: Diagnosis not present

## 2016-09-29 ENCOUNTER — Other Ambulatory Visit: Payer: Self-pay | Admitting: Obstetrics and Gynecology

## 2016-09-29 ENCOUNTER — Other Ambulatory Visit (HOSPITAL_COMMUNITY)
Admission: RE | Admit: 2016-09-29 | Discharge: 2016-09-29 | Disposition: A | Discharge status: Home / Self Care | Payer: Medicaid Other | Source: Ambulatory Visit | Attending: Obstetrics and Gynecology | Admitting: Obstetrics and Gynecology

## 2016-09-29 DIAGNOSIS — Z113 Encounter for screening for infections with a predominantly sexual mode of transmission: Secondary | ICD-10-CM | POA: Diagnosis present

## 2016-09-29 DIAGNOSIS — Z01419 Encounter for gynecological examination (general) (routine) without abnormal findings: Secondary | ICD-10-CM | POA: Insufficient documentation

## 2016-10-04 LAB — CYTOLOGY - PAP
CHLAMYDIA, DNA PROBE: NEGATIVE
Diagnosis: NEGATIVE
NEISSERIA GONORRHEA: NEGATIVE

## 2017-02-09 DIAGNOSIS — M25561 Pain in right knee: Secondary | ICD-10-CM | POA: Diagnosis not present

## 2017-04-05 DIAGNOSIS — F419 Anxiety disorder, unspecified: Secondary | ICD-10-CM | POA: Diagnosis not present

## 2017-04-05 DIAGNOSIS — F330 Major depressive disorder, recurrent, mild: Secondary | ICD-10-CM | POA: Diagnosis not present

## 2017-04-05 DIAGNOSIS — G8929 Other chronic pain: Secondary | ICD-10-CM | POA: Diagnosis not present

## 2017-04-05 DIAGNOSIS — M25561 Pain in right knee: Secondary | ICD-10-CM | POA: Diagnosis not present

## 2017-05-30 DIAGNOSIS — F419 Anxiety disorder, unspecified: Secondary | ICD-10-CM | POA: Diagnosis not present

## 2017-05-30 DIAGNOSIS — M549 Dorsalgia, unspecified: Secondary | ICD-10-CM | POA: Diagnosis not present

## 2017-05-30 DIAGNOSIS — M542 Cervicalgia: Secondary | ICD-10-CM | POA: Diagnosis not present

## 2017-05-30 DIAGNOSIS — F330 Major depressive disorder, recurrent, mild: Secondary | ICD-10-CM | POA: Diagnosis not present

## 2017-06-01 DIAGNOSIS — M6250 Muscle wasting and atrophy, not elsewhere classified, unspecified site: Secondary | ICD-10-CM | POA: Diagnosis not present

## 2017-06-01 DIAGNOSIS — M542 Cervicalgia: Secondary | ICD-10-CM | POA: Diagnosis not present

## 2017-06-01 DIAGNOSIS — R293 Abnormal posture: Secondary | ICD-10-CM | POA: Diagnosis not present

## 2017-06-11 DIAGNOSIS — R293 Abnormal posture: Secondary | ICD-10-CM | POA: Diagnosis not present

## 2017-06-11 DIAGNOSIS — M6250 Muscle wasting and atrophy, not elsewhere classified, unspecified site: Secondary | ICD-10-CM | POA: Diagnosis not present

## 2017-06-11 DIAGNOSIS — M542 Cervicalgia: Secondary | ICD-10-CM | POA: Diagnosis not present

## 2017-07-18 DIAGNOSIS — D1039 Benign neoplasm of other parts of mouth: Secondary | ICD-10-CM | POA: Diagnosis not present

## 2017-09-06 ENCOUNTER — Emergency Department (HOSPITAL_COMMUNITY)
Admission: EM | Admit: 2017-09-06 | Discharge: 2017-09-07 | Disposition: A | Discharge status: Home / Self Care | Payer: Federal, State, Local not specified - PPO | Attending: Emergency Medicine | Admitting: Emergency Medicine

## 2017-09-06 ENCOUNTER — Encounter (HOSPITAL_COMMUNITY): Payer: Self-pay | Admitting: *Deleted

## 2017-09-06 DIAGNOSIS — R197 Diarrhea, unspecified: Secondary | ICD-10-CM | POA: Insufficient documentation

## 2017-09-06 DIAGNOSIS — M791 Myalgia, unspecified site: Secondary | ICD-10-CM | POA: Diagnosis not present

## 2017-09-06 DIAGNOSIS — M7918 Myalgia, other site: Secondary | ICD-10-CM | POA: Diagnosis not present

## 2017-09-06 DIAGNOSIS — Z87891 Personal history of nicotine dependence: Secondary | ICD-10-CM | POA: Insufficient documentation

## 2017-09-06 DIAGNOSIS — J111 Influenza due to unidentified influenza virus with other respiratory manifestations: Secondary | ICD-10-CM | POA: Insufficient documentation

## 2017-09-06 DIAGNOSIS — R509 Fever, unspecified: Secondary | ICD-10-CM | POA: Diagnosis not present

## 2017-09-06 DIAGNOSIS — R10811 Right upper quadrant abdominal tenderness: Secondary | ICD-10-CM | POA: Insufficient documentation

## 2017-09-06 DIAGNOSIS — R112 Nausea with vomiting, unspecified: Secondary | ICD-10-CM | POA: Diagnosis not present

## 2017-09-06 LAB — URINALYSIS, ROUTINE W REFLEX MICROSCOPIC
BILIRUBIN URINE: NEGATIVE
GLUCOSE, UA: NEGATIVE mg/dL
HGB URINE DIPSTICK: NEGATIVE
KETONES UR: 5 mg/dL — AB
LEUKOCYTES UA: NEGATIVE
NITRITE: NEGATIVE
PROTEIN: 30 mg/dL — AB
Specific Gravity, Urine: 1.029 (ref 1.005–1.030)
pH: 5 (ref 5.0–8.0)

## 2017-09-06 LAB — I-STAT BETA HCG BLOOD, ED (MC, WL, AP ONLY)

## 2017-09-06 NOTE — ED Triage Notes (Signed)
Pt stated "I had a fever this morning and diarrhea.  I have body aches everywhere."  Pt denies vomiting.

## 2017-09-06 NOTE — ED Provider Notes (Signed)
[facility]-EMERGENCY DEPT Provider Note   CSN: [id number] Arrival date & time: 09/06/17  2032     History   Chief Complaint Chief Complaint  Patient presents with  . Fever  . Diarrhea    HPI Bonnie [name] is a 27 y.o. female.  HPI   She presents for evaluation of an illness which started today characterized by fever and diarrhea.  She has not had any vomiting.  She complains of rhinorrhea, generalized body aches, and episode of nausea and retching this morning.  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  No other recent illnesses.  There are no other known modifying factors.   Past Medical History:  Diagnosis Date  . Hx of chlamydia infection   . Medical history non-contributory   . Urinary tract infection    hx of    Patient Active Problem List   Diagnosis Date Noted  . Maternal fever during labor, delivered 03/13/2016  . Vaginal delivery 03/13/2016  . First degree perineal laceration during delivery 03/13/2016    Past Surgical History:  Procedure Laterality Date  . DILATION AND EVACUATION    . I&D EXTREMITY Left 07/18/2012   Procedure: LEFT IRRIGATION AND DEBRIDEMENT AND REPAIR OF THE INDEX FINGER NERVES AND  TENDON ;  Surgeon: Dominica Severin, MD;  Location: [facility] OR;  Service: Orthopedics;  Laterality: Left;  . TONSILLECTOMY    . WISDOM TOOTH EXTRACTION       OB History    Gravida  2   Para  1   Term  1   Preterm      AB  1   Living  1     SAB      TAB  1   Ectopic      Multiple  0   Live Births  1            Home Medications    Prior to Admission medications   Medication Sig Start Date End Date Taking? Authorizing Provider  ibuprofen (ADVIL,MOTRIN) 600 MG tablet Take 1 tablet (600 mg total) by mouth every 6 (six) hours as needed. 03/15/16   Gerald Leitz, MD  Prenatal Vit-Fe Fumarate-FA (PRENATAL MULTIVITAMIN) TABS tablet Take 1 tablet by mouth daily.     [provider]    Family History Family History  Problem Relation Age of Onset  . Hypertension Mother   . Hypertension Maternal Grandmother   . Varicose Veins Maternal Grandmother     Social History Social History   Tobacco Use  . Smoking status: Former Smoker    Packs/day: 0.25    Years: 2.00    Pack years: 0.50    Types: Cigars  . Smokeless tobacco: Never Used  Substance Use Topics  . Alcohol use: No    Alcohol/week: 0.0 oz    Comment: oaccasionally  . Drug use: Yes    Types: Marijuana    Comment: last used when found out pregant     Allergies   Patient has no known allergies.   Review of Systems Review of Systems  All other systems reviewed and are negative.    Physical Exam Updated Vital Signs BP 103/63 (BP Location: Left Arm)   Pulse (!) 116   Temp 99.6 F (37.6 C) (Oral)   Resp 16   Ht  (1.6 m)   Wt 79.6 kg (175 lb 8 oz)   LMP 08/26/2017 (Exact Date)  SpO2 96%   BMI 31.09 kg/m   Physical Exam  Constitutional: She is oriented to person, place, and time. She appears well-developed and well-nourished. She appears distressed (Uncomfortable).  HENT:  Head: Normocephalic and atraumatic.  Eyes: Pupils are equal, round, and reactive to light. Conjunctivae and EOM are normal.  Neck: Normal range of motion and phonation normal. Neck supple.  Cardiovascular: Normal rate and regular rhythm.  Pulmonary/Chest: Effort normal and breath sounds normal. She exhibits no tenderness.  Abdominal: Soft. She exhibits no distension and no mass. There is tenderness (Right upper quadrant, mild). There is no rebound and no guarding. No hernia.  Musculoskeletal: Normal range of motion. She exhibits no tenderness or deformity.  Neurological: She is alert and oriented to person, place, and time. She exhibits normal muscle tone.  Skin: Skin is warm and dry.  Psychiatric: She has a normal mood and affect. Her behavior is normal. Judgment and thought content normal.    Nursing note and vitals reviewed.    ED Treatments / Results  Labs (all labs ordered are listed, but only abnormal results are displayed) Labs Reviewed  URINALYSIS, ROUTINE W REFLEX MICROSCOPIC - Abnormal; Notable for the following components:      Result Value   APPearance HAZY (*)    Ketones, ur 5 (*)    Protein, ur 30 (*)    Bacteria, UA RARE (*)    All other components within normal limits  LIPASE, BLOOD  COMPREHENSIVE METABOLIC PANEL  CBC  I-STAT BETA HCG BLOOD, ED ([facility], [facility], [facility] ONLY)    EKG None  Radiology No results found.  Procedures Procedures (including critical care time)  Medications Ordered in ED Medications - No data to display   Initial Impression / Assessment and Plan / ED Course  I have reviewed the triage vital signs and the nursing notes.  Pertinent labs & imaging results that were available during my care of the patient were reviewed by me and considered in my medical decision making (see chart for details).     Patient Vitals for the past 24 hrs:  BP Temp Temp src Pulse Resp SpO2 Height Weight  09/06/17 2117 103/63 99.6 F (37.6 C) Oral (!) 116 16 96 %  (1.6 m) 79.6 kg (175 lb 8 oz)    12:39 AM Reevaluation with update and discussion. After initial assessment and treatment, an updated evaluation reveals no change in clinical status.  Findings discussed with patient, and mother, all questions were answered. Mancel Bale   Medical decision Krystal Clark presents with acute illness, 1 day duration, with signs and symptoms of seasonal influenza.  Also has bacterial infection metabolic instability or impending vascular collapse.  Nursing Notes Reviewed/ Care Coordinated Applicable Imaging Reviewed Interpretation of Laboratory Data incorporated into ED treatment  The patient appears reasonably screened and/or stabilized for discharge and I doubt any other medical condition or other [facility] requiring further screening, evaluation, or treatment in  the ED at this time prior to discharge.  Plan: Home Medications-OTC analgesia; Home Treatments-gradually advance diet; return here if the recommended treatment, does not improve the symptoms; Recommended follow up-he is to be follow-up urine.  Work release for 3 days.     Final Clinical Impressions(s) / ED Diagnoses   Final diagnoses:  Influenza    ED Discharge Orders    None       Mancel Bale, MD 09/07/17 [phone]

## 2017-09-07 LAB — CBC
HEMATOCRIT: 41.2 % (ref 36.0–46.0)
HEMOGLOBIN: 13.8 g/dL (ref 12.0–15.0)
MCH: 29 pg (ref 26.0–34.0)
MCHC: 33.5 g/dL (ref 30.0–36.0)
MCV: 86.6 fL (ref 78.0–100.0)
Platelets: 221 10*3/uL (ref 150–400)
RBC: 4.76 MIL/uL (ref 3.87–5.11)
RDW: 13.4 % (ref 11.5–15.5)
WBC: 6.8 10*3/uL (ref 4.0–10.5)

## 2017-09-07 LAB — COMPREHENSIVE METABOLIC PANEL
ALT: 16 U/L (ref 14–54)
AST: 18 U/L (ref 15–41)
Albumin: 3.7 g/dL (ref 3.5–5.0)
Alkaline Phosphatase: 66 U/L (ref 38–126)
Anion gap: 11 (ref 5–15)
BUN: 12 mg/dL (ref 6–20)
CHLORIDE: 105 mmol/L (ref 101–111)
CO2: 21 mmol/L — AB (ref 22–32)
CREATININE: 0.72 mg/dL (ref 0.44–1.00)
Calcium: 8.9 mg/dL (ref 8.9–10.3)
GFR calc Af Amer: >60 mL/min (ref >60)
GLUCOSE: 98 mg/dL (ref 65–99)
Potassium: 3.3 mmol/L — ABNORMAL LOW (ref 3.5–5.1)
SODIUM: 137 mmol/L (ref 135–145)
Total Bilirubin: 1.2 mg/dL (ref 0.3–1.2)
Total Protein: 7.6 g/dL (ref 6.5–8.1)

## 2017-09-07 LAB — LIPASE, BLOOD: LIPASE: 22 U/L (ref 11–51)

## 2017-09-07 NOTE — Discharge Instructions (Addendum)
Get plenty of rest and drink a lot of fluids especially water.  Use Tylenol Motrin for fever and pain.  Use Kaopectate or Imodium for diarrhea.  Follow-up with the doctor of your choice as needed for problems.

## 2017-10-30 ENCOUNTER — Other Ambulatory Visit (HOSPITAL_COMMUNITY)
Admission: RE | Admit: 2017-10-30 | Discharge: 2017-10-30 | Disposition: A | Discharge status: Home / Self Care | Payer: Federal, State, Local not specified - PPO | Source: Ambulatory Visit | Attending: Obstetrics and Gynecology | Admitting: Obstetrics and Gynecology

## 2017-10-30 ENCOUNTER — Other Ambulatory Visit: Payer: Self-pay | Admitting: Obstetrics and Gynecology

## 2017-10-30 DIAGNOSIS — Z113 Encounter for screening for infections with a predominantly sexual mode of transmission: Secondary | ICD-10-CM | POA: Diagnosis not present

## 2017-10-30 DIAGNOSIS — Z01411 Encounter for gynecological examination (general) (routine) with abnormal findings: Secondary | ICD-10-CM | POA: Diagnosis not present

## 2017-10-30 DIAGNOSIS — N939 Abnormal uterine and vaginal bleeding, unspecified: Secondary | ICD-10-CM | POA: Diagnosis not present

## 2017-11-02 LAB — CYTOLOGY - PAP
CHLAMYDIA, DNA PROBE: NEGATIVE
DIAGNOSIS: NEGATIVE
NEISSERIA GONORRHEA: NEGATIVE

## 2017-12-05 DIAGNOSIS — N939 Abnormal uterine and vaginal bleeding, unspecified: Secondary | ICD-10-CM | POA: Diagnosis not present

## 2018-03-27 DIAGNOSIS — F330 Major depressive disorder, recurrent, mild: Secondary | ICD-10-CM | POA: Diagnosis not present

## 2018-03-27 DIAGNOSIS — F419 Anxiety disorder, unspecified: Secondary | ICD-10-CM | POA: Diagnosis not present

## 2018-03-27 DIAGNOSIS — M25561 Pain in right knee: Secondary | ICD-10-CM | POA: Diagnosis not present

## 2018-04-10 DIAGNOSIS — M25561 Pain in right knee: Secondary | ICD-10-CM | POA: Diagnosis not present

## 2018-05-06 ENCOUNTER — Other Ambulatory Visit: Payer: Self-pay | Admitting: Sports Medicine

## 2018-05-06 DIAGNOSIS — M25561 Pain in right knee: Secondary | ICD-10-CM

## 2018-05-17 DIAGNOSIS — H9201 Otalgia, right ear: Secondary | ICD-10-CM | POA: Diagnosis not present

## 2018-05-17 DIAGNOSIS — J0100 Acute maxillary sinusitis, unspecified: Secondary | ICD-10-CM | POA: Diagnosis not present

## 2018-05-17 DIAGNOSIS — R05 Cough: Secondary | ICD-10-CM | POA: Diagnosis not present

## 2018-05-17 DIAGNOSIS — H6121 Impacted cerumen, right ear: Secondary | ICD-10-CM | POA: Diagnosis not present

## 2018-05-20 ENCOUNTER — Ambulatory Visit
Admission: RE | Admit: 2018-05-20 | Discharge: 2018-05-20 | Disposition: A | Discharge status: Home / Self Care | Payer: Federal, State, Local not specified - PPO | Source: Ambulatory Visit | Attending: Sports Medicine | Admitting: Sports Medicine

## 2018-05-20 DIAGNOSIS — M25561 Pain in right knee: Secondary | ICD-10-CM | POA: Diagnosis not present

## 2018-11-27 DIAGNOSIS — U071 COVID-19: Secondary | ICD-10-CM | POA: Diagnosis not present

## 2018-11-27 DIAGNOSIS — R079 Chest pain, unspecified: Secondary | ICD-10-CM | POA: Diagnosis not present

## 2019-02-06 DIAGNOSIS — Z113 Encounter for screening for infections with a predominantly sexual mode of transmission: Secondary | ICD-10-CM | POA: Diagnosis not present

## 2019-02-06 DIAGNOSIS — Z309 Encounter for contraceptive management, unspecified: Secondary | ICD-10-CM | POA: Diagnosis not present

## 2019-02-06 DIAGNOSIS — N939 Abnormal uterine and vaginal bleeding, unspecified: Secondary | ICD-10-CM | POA: Diagnosis not present

## 2019-05-13 ENCOUNTER — Ambulatory Visit: Payer: Federal, State, Local not specified - PPO | Attending: Internal Medicine

## 2019-05-13 DIAGNOSIS — Z20822 Contact with and (suspected) exposure to covid-19: Secondary | ICD-10-CM | POA: Diagnosis not present

## 2019-05-14 LAB — NOVEL CORONAVIRUS, NAA: SARS-CoV-2, NAA: NOT DETECTED

## 2019-06-11 DIAGNOSIS — Z309 Encounter for contraceptive management, unspecified: Secondary | ICD-10-CM | POA: Diagnosis not present

## 2019-06-11 DIAGNOSIS — N939 Abnormal uterine and vaginal bleeding, unspecified: Secondary | ICD-10-CM | POA: Diagnosis not present

## 2019-06-16 DIAGNOSIS — Z3046 Encounter for surveillance of implantable subdermal contraceptive: Secondary | ICD-10-CM | POA: Diagnosis not present

## 2019-06-16 DIAGNOSIS — Z3202 Encounter for pregnancy test, result negative: Secondary | ICD-10-CM | POA: Diagnosis not present

## 2019-06-16 DIAGNOSIS — Z30430 Encounter for insertion of intrauterine contraceptive device: Secondary | ICD-10-CM | POA: Diagnosis not present

## 2019-06-30 DIAGNOSIS — M7732 Calcaneal spur, left foot: Secondary | ICD-10-CM | POA: Diagnosis not present

## 2019-06-30 DIAGNOSIS — B070 Plantar wart: Secondary | ICD-10-CM | POA: Diagnosis not present

## 2019-07-14 DIAGNOSIS — B070 Plantar wart: Secondary | ICD-10-CM | POA: Diagnosis not present

## 2019-07-28 DIAGNOSIS — B070 Plantar wart: Secondary | ICD-10-CM | POA: Diagnosis not present

## 2019-07-28 DIAGNOSIS — Z30431 Encounter for routine checking of intrauterine contraceptive device: Secondary | ICD-10-CM | POA: Diagnosis not present

## 2019-07-28 DIAGNOSIS — N898 Other specified noninflammatory disorders of vagina: Secondary | ICD-10-CM | POA: Diagnosis not present

## 2019-07-28 DIAGNOSIS — Z113 Encounter for screening for infections with a predominantly sexual mode of transmission: Secondary | ICD-10-CM | POA: Diagnosis not present

## 2019-08-20 ENCOUNTER — Emergency Department (HOSPITAL_BASED_OUTPATIENT_CLINIC_OR_DEPARTMENT_OTHER)
Admission: EM | Admit: 2019-08-20 | Discharge: 2019-08-21 | Disposition: A | Discharge status: Home / Self Care | Payer: Federal, State, Local not specified - PPO | Attending: Emergency Medicine | Admitting: Emergency Medicine

## 2019-08-20 ENCOUNTER — Encounter (HOSPITAL_BASED_OUTPATIENT_CLINIC_OR_DEPARTMENT_OTHER): Payer: Self-pay | Admitting: Emergency Medicine

## 2019-08-20 ENCOUNTER — Other Ambulatory Visit: Payer: Self-pay

## 2019-08-20 DIAGNOSIS — N12 Tubulo-interstitial nephritis, not specified as acute or chronic: Secondary | ICD-10-CM

## 2019-08-20 DIAGNOSIS — N390 Urinary tract infection, site not specified: Secondary | ICD-10-CM | POA: Diagnosis not present

## 2019-08-20 DIAGNOSIS — Z87891 Personal history of nicotine dependence: Secondary | ICD-10-CM | POA: Insufficient documentation

## 2019-08-20 DIAGNOSIS — Z79899 Other long term (current) drug therapy: Secondary | ICD-10-CM | POA: Insufficient documentation

## 2019-08-20 DIAGNOSIS — R109 Unspecified abdominal pain: Secondary | ICD-10-CM | POA: Diagnosis not present

## 2019-08-20 NOTE — ED Triage Notes (Signed)
Right lower back pain for 2 days, increased 2 hours ago. Denies dysuria or vag. Discharge. NO injury. Amb with slow gait

## 2019-08-20 NOTE — ED Provider Notes (Signed)
[facility] Provider Note: Bonnie Dell, MD, FACEP  CSN: [id number] MRN: [id number] ARRIVAL: 08/20/19 at 2337 ROOM: MH10/MH10   CHIEF COMPLAINT  Back Pain   HISTORY OF PRESENT ILLNESS  08/20/19 11:59 PM Bonnie [name] is a 29 y.o. female with a 6-day history of pain in her right flank.  She describes the pain is burning and rates it as a 10 out of 10.  It is worse with movement or ambulation.  She is not aware of any trauma or lifting that that may have triggered this.  She has been taking over-the-counter analgesics without relief.  She denies any dysuria, hematuria, vaginal bleeding or discharge.  She denies fever or chills.   Past Medical History:  Diagnosis Date  . Hx of chlamydia infection   . Medical history non-contributory   . Urinary tract infection    hx of    Past Surgical History:  Procedure Laterality Date  . DILATION AND EVACUATION    . I & D EXTREMITY Left 07/18/2012   Procedure: LEFT IRRIGATION AND DEBRIDEMENT AND REPAIR OF THE INDEX FINGER NERVES AND  TENDON ;  Surgeon: Dominica Severin, MD;  Location: [facility] OR;  Service: Orthopedics;  Laterality: Left;  . TONSILLECTOMY    . WISDOM TOOTH EXTRACTION      Family History  Problem Relation Age of Onset  . Hypertension Mother   . Hypertension Maternal Grandmother   . Varicose Veins Maternal Grandmother     Social History   Tobacco Use  . Smoking status: Former Smoker    Packs/day: 0.25    Years: 2.00    Pack years: 0.50    Types: Cigars  . Smokeless tobacco: Never Used  Substance Use Topics  . Alcohol use: No    Alcohol/week: 0.0 standard drinks    Comment: oaccasionally  . Drug use: Yes    Types: Marijuana    Comment: last used when found out pregant    Prior to Admission medications   Medication Sig Start Date End Date Taking? Authorizing Provider  ciprofloxacin (CIPRO) 500 MG tablet Take 1 tablet (500 mg total) by mouth 2 (two) times daily. One po bid x 7 days 08/21/19    Roosevelt Eimers, MD  HYDROcodone-acetaminophen (NORCO) 5-325 MG tablet Take 1 tablet by mouth every 4 (four) hours as needed for severe pain. 08/21/19   Suha Schoenbeck, Jonny Ruiz, MD  Pediatric Multiple Vit-C-FA (MULTIVITAMIN ANIMAL SHAPES, WITH CA/FA,) with C & FA chewable tablet Chew 1 tablet by mouth daily.    [provider]    Allergies Patient has no known allergies.   REVIEW OF SYSTEMS  Negative except as noted here or in the History of Present Illness.   PHYSICAL EXAMINATION  Initial Vital Signs Blood pressure 132/78, pulse 83, temperature 98.9 F (37.2 C), temperature source Oral, resp. rate 20, weight 80 kg, last menstrual period 08/10/2019, SpO2 99 %, unknown if currently breastfeeding.  Examination General: Well-developed, well-nourished female in no acute distress; appearance consistent with age of record HENT: normocephalic; atraumatic Eyes: Normal appearance Neck: supple Heart: regular rate and rhythm Lungs: clear to auscultation bilaterally Abdomen: soft; nondistended; nontender; bowel sounds present Back: Right CVA tenderness Extremities: No deformity; full range of motion; pulses normal Neurologic: Awake, alert and oriented; motor function intact in all extremities and symmetric; no facial droop Skin: Warm and dry Psychiatric: Normal mood and affect   RESULTS  Summary of this visit's results, reviewed and interpreted by myself:   EKG Interpretation  Date/Time:    Ventricular Rate:    PR Interval:    QRS Duration:   QT Interval:    QTC Calculation:   R Axis:     Text Interpretation:        Laboratory Studies: Results for orders placed or performed during the hospital encounter of 08/20/19 (from the past 24 hour(s))  Pregnancy, urine     Status: None   Collection Time: 08/20/19 11:58 PM  Result Value Ref Range   Preg Test, Ur NEGATIVE NEGATIVE  Urinalysis, Routine w reflex microscopic     Status: Abnormal   Collection Time: 08/20/19 11:58 PM  Result  Value Ref Range   Color, Urine YELLOW YELLOW   APPearance CLOUDY (A) CLEAR   Specific Gravity, Urine 1.010 1.005 - 1.030   pH 6.0 5.0 - 8.0   Glucose, UA NEGATIVE NEGATIVE mg/dL   Hgb urine dipstick NEGATIVE NEGATIVE   Bilirubin Urine NEGATIVE NEGATIVE   Ketones, ur NEGATIVE NEGATIVE mg/dL   Protein, ur NEGATIVE NEGATIVE mg/dL   Nitrite POSITIVE (A) NEGATIVE   Leukocytes,Ua SMALL (A) NEGATIVE  Urinalysis, Microscopic (reflex)     Status: Abnormal   Collection Time: 08/20/19 11:58 PM  Result Value Ref Range   RBC / HPF 0-5 0 - 5 RBC/hpf   WBC, UA 21-50 0 - 5 WBC/hpf   Bacteria, UA MANY (A) NONE SEEN   Squamous Epithelial / LPF 6-10 0 - 5   Imaging Studies: No results found.  ED COURSE and MDM  Nursing notes, initial and subsequent vitals signs, including pulse oximetry, reviewed and interpreted by myself.  Vitals:   08/20/19 2354 08/20/19 2355  BP: 132/78   Pulse: 83   Resp: 20   Temp: 98.9 F (37.2 C)   TempSrc: Oral   SpO2: 99%   Weight:  80 kg   Medications  ciprofloxacin (CIPRO) tablet 500 mg (has no administration in time range)    Urinalysis is consistent with urinary tract infection.  Although she has no dysuria, fever or chills she does have significant right CVA pain and tenderness concerning for pyelonephritis.  We will treat for pyelonephritis.  Urine has been sent for culture.  PROCEDURES  Procedures   ED DIAGNOSES     ICD-10-CM   1. Pyelonephritis  N12        Adara Kittle, MD 08/21/19 [phone]

## 2019-08-21 DIAGNOSIS — N12 Tubulo-interstitial nephritis, not specified as acute or chronic: Secondary | ICD-10-CM | POA: Diagnosis not present

## 2019-08-21 LAB — URINALYSIS, ROUTINE W REFLEX MICROSCOPIC
Bilirubin Urine: NEGATIVE
Glucose, UA: NEGATIVE mg/dL
Hgb urine dipstick: NEGATIVE
Ketones, ur: NEGATIVE mg/dL
Nitrite: POSITIVE — AB
Protein, ur: NEGATIVE mg/dL
Specific Gravity, Urine: 1.01 (ref 1.005–1.030)
pH: 6 (ref 5.0–8.0)

## 2019-08-21 LAB — URINALYSIS, MICROSCOPIC (REFLEX)

## 2019-08-21 LAB — PREGNANCY, URINE: Preg Test, Ur: NEGATIVE

## 2019-08-21 MED ORDER — CIPROFLOXACIN HCL 500 MG PO TABS
500.0000 mg | ORAL_TABLET | Freq: Once | ORAL | Status: AC
  Administered 2019-08-21: 500 mg via ORAL
  Filled 2019-08-21: qty 1

## 2019-08-21 MED ORDER — CIPROFLOXACIN HCL 500 MG PO TABS: 500.0000 mg | ORAL_TABLET | Freq: Two times a day (BID) | ORAL | 0 refills | Status: DC

## 2019-08-21 MED ORDER — HYDROCODONE-ACETAMINOPHEN 5-325 MG PO TABS: 1.0000 | ORAL_TABLET | ORAL | 0 refills | Status: DC | PRN

## 2019-08-23 LAB — URINE CULTURE: Culture: >=100000 — AB

## 2019-09-03 DIAGNOSIS — Z20822 Contact with and (suspected) exposure to covid-19: Secondary | ICD-10-CM | POA: Diagnosis not present

## 2019-09-03 DIAGNOSIS — Z03818 Encounter for observation for suspected exposure to other biological agents ruled out: Secondary | ICD-10-CM | POA: Diagnosis not present

## 2019-12-22 DIAGNOSIS — Z113 Encounter for screening for infections with a predominantly sexual mode of transmission: Secondary | ICD-10-CM | POA: Diagnosis not present

## 2019-12-22 DIAGNOSIS — Z01419 Encounter for gynecological examination (general) (routine) without abnormal findings: Secondary | ICD-10-CM | POA: Diagnosis not present

## 2020-01-06 DIAGNOSIS — Z20822 Contact with and (suspected) exposure to covid-19: Secondary | ICD-10-CM | POA: Diagnosis not present

## 2020-01-13 DIAGNOSIS — F419 Anxiety disorder, unspecified: Secondary | ICD-10-CM | POA: Diagnosis not present

## 2020-01-13 DIAGNOSIS — F3341 Major depressive disorder, recurrent, in partial remission: Secondary | ICD-10-CM | POA: Diagnosis not present

## 2020-01-15 DIAGNOSIS — Z20822 Contact with and (suspected) exposure to covid-19: Secondary | ICD-10-CM | POA: Diagnosis not present

## 2020-01-19 DIAGNOSIS — Z20822 Contact with and (suspected) exposure to covid-19: Secondary | ICD-10-CM | POA: Diagnosis not present

## 2020-02-24 DIAGNOSIS — Z20822 Contact with and (suspected) exposure to covid-19: Secondary | ICD-10-CM | POA: Diagnosis not present

## 2020-04-06 DIAGNOSIS — Z20822 Contact with and (suspected) exposure to covid-19: Secondary | ICD-10-CM | POA: Diagnosis not present

## 2020-05-11 DIAGNOSIS — R509 Fever, unspecified: Secondary | ICD-10-CM | POA: Diagnosis not present

## 2020-05-11 DIAGNOSIS — R0981 Nasal congestion: Secondary | ICD-10-CM | POA: Diagnosis not present

## 2020-05-11 DIAGNOSIS — B349 Viral infection, unspecified: Secondary | ICD-10-CM | POA: Diagnosis not present

## 2020-05-11 DIAGNOSIS — U071 COVID-19: Secondary | ICD-10-CM | POA: Diagnosis not present

## 2020-06-10 DIAGNOSIS — L02411 Cutaneous abscess of right axilla: Secondary | ICD-10-CM | POA: Diagnosis not present

## 2020-07-16 DIAGNOSIS — L0292 Furuncle, unspecified: Secondary | ICD-10-CM | POA: Diagnosis not present

## 2020-07-16 DIAGNOSIS — M5442 Lumbago with sciatica, left side: Secondary | ICD-10-CM | POA: Diagnosis not present

## 2020-09-03 DIAGNOSIS — Z20822 Contact with and (suspected) exposure to covid-19: Secondary | ICD-10-CM | POA: Diagnosis not present

## 2020-09-03 DIAGNOSIS — Z03818 Encounter for observation for suspected exposure to other biological agents ruled out: Secondary | ICD-10-CM | POA: Diagnosis not present

## 2020-09-08 DIAGNOSIS — M79671 Pain in right foot: Secondary | ICD-10-CM | POA: Diagnosis not present

## 2020-09-09 DIAGNOSIS — M25561 Pain in right knee: Secondary | ICD-10-CM | POA: Diagnosis not present

## 2020-09-09 DIAGNOSIS — F419 Anxiety disorder, unspecified: Secondary | ICD-10-CM | POA: Diagnosis not present

## 2020-09-09 DIAGNOSIS — G8929 Other chronic pain: Secondary | ICD-10-CM | POA: Diagnosis not present

## 2020-09-09 DIAGNOSIS — F3341 Major depressive disorder, recurrent, in partial remission: Secondary | ICD-10-CM | POA: Diagnosis not present

## 2020-09-27 DIAGNOSIS — L732 Hidradenitis suppurativa: Secondary | ICD-10-CM | POA: Diagnosis not present

## 2021-04-14 DIAGNOSIS — N898 Other specified noninflammatory disorders of vagina: Secondary | ICD-10-CM | POA: Diagnosis not present

## 2021-04-14 DIAGNOSIS — N760 Acute vaginitis: Secondary | ICD-10-CM | POA: Diagnosis not present

## 2021-04-14 DIAGNOSIS — A5901 Trichomonal vulvovaginitis: Secondary | ICD-10-CM | POA: Diagnosis not present

## 2021-04-14 DIAGNOSIS — Z01411 Encounter for gynecological examination (general) (routine) with abnormal findings: Secondary | ICD-10-CM | POA: Diagnosis not present

## 2021-04-14 DIAGNOSIS — E669 Obesity, unspecified: Secondary | ICD-10-CM | POA: Diagnosis not present

## 2021-04-14 DIAGNOSIS — Z113 Encounter for screening for infections with a predominantly sexual mode of transmission: Secondary | ICD-10-CM | POA: Diagnosis not present

## 2021-04-14 DIAGNOSIS — Z13220 Encounter for screening for lipoid disorders: Secondary | ICD-10-CM | POA: Diagnosis not present

## 2021-05-26 DIAGNOSIS — Z23 Encounter for immunization: Secondary | ICD-10-CM | POA: Diagnosis not present

## 2021-05-26 DIAGNOSIS — Z13220 Encounter for screening for lipoid disorders: Secondary | ICD-10-CM | POA: Diagnosis not present

## 2021-05-26 DIAGNOSIS — Z0000 Encounter for general adult medical examination without abnormal findings: Secondary | ICD-10-CM | POA: Diagnosis not present

## 2021-05-26 DIAGNOSIS — G8929 Other chronic pain: Secondary | ICD-10-CM | POA: Diagnosis not present

## 2021-05-26 DIAGNOSIS — F3341 Major depressive disorder, recurrent, in partial remission: Secondary | ICD-10-CM | POA: Diagnosis not present

## 2021-05-26 DIAGNOSIS — F419 Anxiety disorder, unspecified: Secondary | ICD-10-CM | POA: Diagnosis not present

## 2021-06-07 DIAGNOSIS — J029 Acute pharyngitis, unspecified: Secondary | ICD-10-CM | POA: Diagnosis not present

## 2021-06-07 DIAGNOSIS — Z03818 Encounter for observation for suspected exposure to other biological agents ruled out: Secondary | ICD-10-CM | POA: Diagnosis not present

## 2021-06-16 DIAGNOSIS — Z113 Encounter for screening for infections with a predominantly sexual mode of transmission: Secondary | ICD-10-CM | POA: Diagnosis not present

## 2021-06-16 DIAGNOSIS — Z3202 Encounter for pregnancy test, result negative: Secondary | ICD-10-CM | POA: Diagnosis not present

## 2021-06-16 DIAGNOSIS — N912 Amenorrhea, unspecified: Secondary | ICD-10-CM | POA: Diagnosis not present

## 2021-06-16 DIAGNOSIS — N898 Other specified noninflammatory disorders of vagina: Secondary | ICD-10-CM | POA: Diagnosis not present

## 2021-07-04 DIAGNOSIS — U071 COVID-19: Secondary | ICD-10-CM | POA: Diagnosis not present

## 2021-07-04 DIAGNOSIS — B349 Viral infection, unspecified: Secondary | ICD-10-CM | POA: Diagnosis not present

## 2021-09-23 DIAGNOSIS — Z23 Encounter for immunization: Secondary | ICD-10-CM | POA: Diagnosis not present

## 2021-10-18 DIAGNOSIS — J029 Acute pharyngitis, unspecified: Secondary | ICD-10-CM | POA: Diagnosis not present

## 2021-10-27 DIAGNOSIS — J029 Acute pharyngitis, unspecified: Secondary | ICD-10-CM | POA: Diagnosis not present

## 2021-10-27 DIAGNOSIS — Z6835 Body mass index (BMI) 35.0-35.9, adult: Secondary | ICD-10-CM | POA: Diagnosis not present

## 2022-01-26 DIAGNOSIS — H93292 Other abnormal auditory perceptions, left ear: Secondary | ICD-10-CM | POA: Diagnosis not present

## 2022-01-26 DIAGNOSIS — H6123 Impacted cerumen, bilateral: Secondary | ICD-10-CM | POA: Diagnosis not present

## 2022-01-26 DIAGNOSIS — J343 Hypertrophy of nasal turbinates: Secondary | ICD-10-CM | POA: Diagnosis not present

## 2022-01-26 DIAGNOSIS — R0683 Snoring: Secondary | ICD-10-CM | POA: Diagnosis not present

## 2022-02-06 ENCOUNTER — Emergency Department (HOSPITAL_BASED_OUTPATIENT_CLINIC_OR_DEPARTMENT_OTHER): Payer: Federal, State, Local not specified - PPO

## 2022-02-06 ENCOUNTER — Encounter (HOSPITAL_BASED_OUTPATIENT_CLINIC_OR_DEPARTMENT_OTHER): Payer: Self-pay

## 2022-02-06 ENCOUNTER — Other Ambulatory Visit: Payer: Self-pay

## 2022-02-06 ENCOUNTER — Observation Stay (HOSPITAL_BASED_OUTPATIENT_CLINIC_OR_DEPARTMENT_OTHER)
Admission: EM | Admit: 2022-02-06 | Discharge: 2022-02-08 | Disposition: A | Discharge status: Home / Self Care | Payer: Federal, State, Local not specified - PPO | Attending: General Surgery | Admitting: General Surgery

## 2022-02-06 DIAGNOSIS — K810 Acute cholecystitis: Secondary | ICD-10-CM | POA: Diagnosis present

## 2022-02-06 DIAGNOSIS — N200 Calculus of kidney: Secondary | ICD-10-CM | POA: Diagnosis not present

## 2022-02-06 DIAGNOSIS — R109 Unspecified abdominal pain: Secondary | ICD-10-CM | POA: Diagnosis not present

## 2022-02-06 DIAGNOSIS — Z87891 Personal history of nicotine dependence: Secondary | ICD-10-CM | POA: Insufficient documentation

## 2022-02-06 DIAGNOSIS — K8000 Calculus of gallbladder with acute cholecystitis without obstruction: Principal | ICD-10-CM | POA: Insufficient documentation

## 2022-02-06 DIAGNOSIS — R1011 Right upper quadrant pain: Secondary | ICD-10-CM | POA: Diagnosis not present

## 2022-02-06 DIAGNOSIS — N3289 Other specified disorders of bladder: Secondary | ICD-10-CM | POA: Diagnosis not present

## 2022-02-06 DIAGNOSIS — K8020 Calculus of gallbladder without cholecystitis without obstruction: Secondary | ICD-10-CM | POA: Diagnosis not present

## 2022-02-06 DIAGNOSIS — R1013 Epigastric pain: Secondary | ICD-10-CM | POA: Diagnosis not present

## 2022-02-06 LAB — URINALYSIS, ROUTINE W REFLEX MICROSCOPIC
Bilirubin Urine: NEGATIVE
Glucose, UA: NEGATIVE mg/dL
Hgb urine dipstick: NEGATIVE
Ketones, ur: NEGATIVE mg/dL
Leukocytes,Ua: NEGATIVE
Nitrite: NEGATIVE
Protein, ur: NEGATIVE mg/dL
Specific Gravity, Urine: 1.02 (ref 1.005–1.030)
pH: 7.5 (ref 5.0–8.0)

## 2022-02-06 LAB — COMPREHENSIVE METABOLIC PANEL
ALT: 14 U/L (ref 0–44)
AST: 15 U/L (ref 15–41)
Albumin: 4.6 g/dL (ref 3.5–5.0)
Alkaline Phosphatase: 61 U/L (ref 38–126)
Anion gap: 11 (ref 5–15)
BUN: 9 mg/dL (ref 6–20)
CO2: 26 mmol/L (ref 22–32)
Calcium: 10 mg/dL (ref 8.9–10.3)
Chloride: 102 mmol/L (ref 98–111)
Creatinine, Ser: 0.77 mg/dL (ref 0.44–1.00)
GFR, Estimated: >60 mL/min (ref >60)
Glucose, Bld: 116 mg/dL — ABNORMAL HIGH (ref 70–99)
Potassium: 4.1 mmol/L (ref 3.5–5.1)
Sodium: 139 mmol/L (ref 135–145)
Total Bilirubin: 0.5 mg/dL (ref 0.3–1.2)
Total Protein: 8.4 g/dL — ABNORMAL HIGH (ref 6.5–8.1)

## 2022-02-06 LAB — CBC
HCT: 43.6 % (ref 36.0–46.0)
Hemoglobin: 14.8 g/dL (ref 12.0–15.0)
MCH: 28.7 pg (ref 26.0–34.0)
MCHC: 33.9 g/dL (ref 30.0–36.0)
MCV: 84.5 fL (ref 80.0–100.0)
Platelets: 286 10*3/uL (ref 150–400)
RBC: 5.16 MIL/uL — ABNORMAL HIGH (ref 3.87–5.11)
RDW: 13.1 % (ref 11.5–15.5)
WBC: 8.5 10*3/uL (ref 4.0–10.5)
nRBC: 0 % (ref 0.0–0.2)

## 2022-02-06 LAB — PREGNANCY, URINE: Preg Test, Ur: NEGATIVE

## 2022-02-06 LAB — LIPASE, BLOOD: Lipase: <10 U/L — ABNORMAL LOW (ref 11–51)

## 2022-02-06 MED ORDER — METHOCARBAMOL 1000 MG/10ML IJ SOLN
500.0000 mg | Freq: Three times a day (TID) | INTRAVENOUS | Status: DC | PRN
  Filled 2022-02-06 (×2): qty 5

## 2022-02-06 MED ORDER — MORPHINE SULFATE (PF) 2 MG/ML IV SOLN
2.0000 mg | INTRAVENOUS | Status: DC | PRN
  Administered 2022-02-06 – 2022-02-08 (×5): 2 mg via INTRAVENOUS
  Filled 2022-02-06 (×5): qty 1

## 2022-02-06 MED ORDER — IOHEXOL 300 MG/ML  SOLN
100.0000 mL | Freq: Once | INTRAMUSCULAR | Status: AC | PRN
  Administered 2022-02-06: 80 mL via INTRAVENOUS

## 2022-02-06 MED ORDER — ENOXAPARIN SODIUM 40 MG/0.4ML IJ SOSY
40.0000 mg | PREFILLED_SYRINGE | INTRAMUSCULAR | Status: DC
  Administered 2022-02-06 – 2022-02-07 (×2): 40 mg via SUBCUTANEOUS
  Filled 2022-02-06 (×2): qty 0.4

## 2022-02-06 MED ORDER — METHOCARBAMOL 500 MG PO TABS
500.0000 mg | ORAL_TABLET | Freq: Three times a day (TID) | ORAL | Status: DC | PRN
  Administered 2022-02-06 – 2022-02-07 (×2): 500 mg via ORAL
  Filled 2022-02-06 (×2): qty 1

## 2022-02-06 MED ORDER — HYDROMORPHONE HCL 1 MG/ML IJ SOLN
1.0000 mg | Freq: Once | INTRAMUSCULAR | Status: AC
  Administered 2022-02-06: 1 mg via INTRAVENOUS
  Filled 2022-02-06: qty 1

## 2022-02-06 MED ORDER — ONDANSETRON HCL 4 MG/2ML IJ SOLN
4.0000 mg | Freq: Once | INTRAMUSCULAR | Status: AC
  Administered 2022-02-06: 4 mg via INTRAVENOUS
  Filled 2022-02-06: qty 2

## 2022-02-06 MED ORDER — ACETAMINOPHEN 325 MG PO TABS: 650.0000 mg | ORAL_TABLET | Freq: Four times a day (QID) | ORAL | Status: DC | PRN

## 2022-02-06 MED ORDER — MORPHINE SULFATE (PF) 4 MG/ML IV SOLN
4.0000 mg | Freq: Once | INTRAVENOUS | Status: AC
  Administered 2022-02-06: 4 mg via INTRAVENOUS
  Filled 2022-02-06: qty 1

## 2022-02-06 MED ORDER — HYDRALAZINE HCL 20 MG/ML IJ SOLN: 10.0000 mg | INTRAMUSCULAR | Status: DC | PRN

## 2022-02-06 MED ORDER — SODIUM CHLORIDE 0.9 % IV SOLN
2.0000 g | Freq: Once | INTRAVENOUS | Status: AC
  Administered 2022-02-06: 2 g via INTRAVENOUS
  Filled 2022-02-06: qty 20

## 2022-02-06 MED ORDER — ACETAMINOPHEN 650 MG RE SUPP: 650.0000 mg | Freq: Four times a day (QID) | RECTAL | Status: DC | PRN

## 2022-02-06 MED ORDER — FENTANYL CITRATE PF 50 MCG/ML IJ SOSY
50.0000 ug | PREFILLED_SYRINGE | Freq: Once | INTRAMUSCULAR | Status: AC
  Administered 2022-02-06: 50 ug via INTRAVENOUS
  Filled 2022-02-06: qty 1

## 2022-02-06 MED ORDER — SODIUM CHLORIDE 0.9 % IV SOLN
2.0000 g | INTRAVENOUS | Status: DC
  Administered 2022-02-07 (×2): 2 g via INTRAVENOUS
  Filled 2022-02-06 (×2): qty 20

## 2022-02-06 MED ORDER — DOCUSATE SODIUM 100 MG PO CAPS
100.0000 mg | ORAL_CAPSULE | Freq: Two times a day (BID) | ORAL | Status: DC
  Administered 2022-02-07 – 2022-02-08 (×3): 100 mg via ORAL
  Filled 2022-02-06 (×3): qty 1

## 2022-02-06 MED ORDER — ONDANSETRON HCL 4 MG/2ML IJ SOLN
4.0000 mg | Freq: Four times a day (QID) | INTRAMUSCULAR | Status: DC | PRN
  Administered 2022-02-06 – 2022-02-07 (×2): 4 mg via INTRAVENOUS
  Filled 2022-02-06 (×2): qty 2

## 2022-02-06 MED ORDER — OXYCODONE HCL 5 MG PO TABS
5.0000 mg | ORAL_TABLET | ORAL | Status: DC | PRN
  Administered 2022-02-06 – 2022-02-08 (×6): 10 mg via ORAL
  Filled 2022-02-06 (×6): qty 2

## 2022-02-06 MED ORDER — SIMETHICONE 80 MG PO CHEW
40.0000 mg | CHEWABLE_TABLET | Freq: Four times a day (QID) | ORAL | Status: DC | PRN
  Administered 2022-02-07 – 2022-02-08 (×2): 40 mg via ORAL
  Filled 2022-02-06 (×3): qty 1

## 2022-02-06 MED ORDER — SODIUM CHLORIDE 0.9 % IV BOLUS
1000.0000 mL | Freq: Once | INTRAVENOUS | Status: AC
  Administered 2022-02-06: 1000 mL via INTRAVENOUS

## 2022-02-06 MED ORDER — ONDANSETRON 4 MG PO TBDP: 4.0000 mg | ORAL_TABLET | Freq: Four times a day (QID) | ORAL | Status: DC | PRN

## 2022-02-06 MED ORDER — SODIUM CHLORIDE 0.9 % IV SOLN: Freq: Once | INTRAVENOUS | Status: DC

## 2022-02-06 MED ORDER — DIPHENHYDRAMINE HCL 50 MG/ML IJ SOLN: 25.0000 mg | Freq: Four times a day (QID) | INTRAMUSCULAR | Status: DC | PRN

## 2022-02-06 MED ORDER — DIPHENHYDRAMINE HCL 25 MG PO CAPS: 25.0000 mg | ORAL_CAPSULE | Freq: Four times a day (QID) | ORAL | Status: DC | PRN

## 2022-02-06 MED ORDER — SODIUM CHLORIDE 0.9 % IV SOLN: INTRAVENOUS | Status: DC

## 2022-02-06 MED ORDER — POLYETHYLENE GLYCOL 3350 17 G PO PACK: 17.0000 g | PACK | Freq: Every day | ORAL | Status: DC | PRN

## 2022-02-06 NOTE — ED Notes (Signed)
[facility] called for transportation @ 6:30 pm

## 2022-02-06 NOTE — ED Notes (Signed)
ED Provider at bedside. 

## 2022-02-06 NOTE — ED Notes (Signed)
[facility] arrived  

## 2022-02-06 NOTE — ED Provider Notes (Signed)
[facility] EMERGENCY DEPT Provider Note   CSN: [id number] Arrival date & time: 02/06/22  1032     History  Chief Complaint  Patient presents with   Abdominal Pain    Bonnie [name] is a 31 y.o. female.  Patient is here with abdominal pain started this morning.  Mostly in the central part of her abdomen may be worse in the right side.  Denies any history of surgery.  Has had nausea and vomiting.  She has had normal bowel movements.  She not been recently ill.  Denies any pain with urination.  She has IUD and has not had cycle in over a year.  Denies any fevers or chills.  Denies any alcohol or drug use.  Denies any vaginal bleeding or vaginal discharge or concern for STDs.  The history is provided by the patient.       Home Medications Prior to Admission medications   Medication Sig Start Date End Date Taking? Authorizing Provider  ciprofloxacin (CIPRO) 500 MG tablet Take 1 tablet (500 mg total) by mouth 2 (two) times daily. One po bid x 7 days 08/21/19   Molpus, John, MD  HYDROcodone-acetaminophen (NORCO) 5-325 MG tablet Take 1 tablet by mouth every 4 (four) hours as needed for severe pain. 08/21/19   Molpus, Jonny Ruiz, MD  Pediatric Multiple Vit-C-FA (MULTIVITAMIN ANIMAL SHAPES, WITH CA/FA,) with C & FA chewable tablet Chew 1 tablet by mouth daily.    [provider]      Allergies    Patient has no known allergies.    Review of Systems   Review of Systems  Physical Exam Updated Vital Signs BP 107/74 (BP Location: Left Arm)   Pulse (!) 55   Temp 98.6 F (37 C) (Oral)   Resp 18   Ht 5\' 3"  (1.6 m)   Wt 88.9 kg   SpO2 95%   BMI 34.72 kg/m  Physical Exam Vitals and nursing note reviewed.  Constitutional:      General: She is not in acute distress.    Appearance: She is well-developed. She is not ill-appearing.  HENT:     Head: Normocephalic and atraumatic.     Mouth/Throat:     Mouth: Mucous membranes are moist.  Eyes:     Extraocular  Movements: Extraocular movements intact.     Conjunctiva/sclera: Conjunctivae normal.  Cardiovascular:     Rate and Rhythm: Normal rate and regular rhythm.     Heart sounds: Normal heart sounds. No murmur heard. Pulmonary:     Effort: Pulmonary effort is normal. No respiratory distress.     Breath sounds: Normal breath sounds.  Abdominal:     Palpations: Abdomen is soft.     Tenderness: There is generalized abdominal tenderness.  Musculoskeletal:        General: No swelling.     Cervical back: Neck supple.  Skin:    General: Skin is warm and dry.     Capillary Refill: Capillary refill takes less than 2 seconds.  Neurological:     Mental Status: She is alert.  Psychiatric:        Mood and Affect: Mood normal.     ED Results / Procedures / Treatments   Labs (all labs ordered are listed, but only abnormal results are displayed) Labs Reviewed  LIPASE, BLOOD - Abnormal; Notable for the following components:      Result Value   Lipase <10 (*)    All other components within normal limits  COMPREHENSIVE  METABOLIC PANEL - Abnormal; Notable for the following components:   Glucose, Bld 116 (*)    Total Protein 8.4 (*)    All other components within normal limits  CBC - Abnormal; Notable for the following components:   RBC 5.16 (*)    All other components within normal limits  URINALYSIS, ROUTINE W REFLEX MICROSCOPIC  PREGNANCY, URINE    EKG None  Radiology US Abdomen Limited RUQ (LIVER/GB)  Result Date: 02/06/2022 CLINICAL DATA:  RIGHT upper quadrant pain since 5 o'clock EXAM: ULTRASOUND ABDOMEN LIMITED RIGHT UPPER QUADRANT COMPARISON:  None Available. FINDINGS: Gallbladder: Multiple dependent gallstones. Stones measure from 1 to 1.5 cm. No gallbladder distension or pericholecystic fluid. Positive sonographic Murphy's sign. Common bile duct: Diameter: Normal at 3 mm Liver: No focal lesion identified. Within normal limits in parenchymal echogenicity. Portal vein is patent on  color Doppler imaging with normal direction of blood flow towards the liver. Other: None. IMPRESSION: Cholelithiasis with positive sonographic Murphy's sign is concerning for acute cholecystitis. Consider nuclear medicine HIDA scan for confirmation. Electronically Signed   By: Suzy Bouchard M.D.   On: 02/06/2022 13:49   CT ABDOMEN PELVIS W CONTRAST  Result Date: 02/06/2022 CLINICAL DATA:  Centralized abdominal pain since this a.m. EXAM: CT ABDOMEN AND PELVIS WITH CONTRAST TECHNIQUE: Multidetector CT imaging of the abdomen and pelvis was performed using the standard protocol following bolus administration of intravenous contrast. RADIATION DOSE REDUCTION: This exam was performed according to the departmental dose-optimization program which includes automated exposure control, adjustment of the mA and/or kV according to patient size and/or use of iterative reconstruction technique. CONTRAST:  54mL OMNIPAQUE IOHEXOL 300 MG/ML  SOLN COMPARISON:  None Available. FINDINGS: Lower chest: No acute abnormality. Hepatobiliary: No suspicious hepatic lesion. Cholelithiasis in a mildly distended gallbladder. No biliary ductal dilation. Pancreas: No pancreatic ductal dilation or evidence of acute inflammation. Spleen: No splenomegaly or focal splenic lesion. Adrenals/Urinary Tract: Bilateral adrenal glands appear normal. No hydronephrosis. Punctate nonobstructive right lower pole renal stone. Mild wall thickening of an incompletely distended urinary bladder. Stomach/Bowel: No radiopaque enteric contrast material was administered. Stomach is unremarkable for degree of distension. No pathologic dilation of small or large bowel. Normal appendix. No evidence of acute bowel inflammation. Vascular/Lymphatic: Normal caliber abdominal aorta. No pathologically enlarged abdominal or pelvic lymph nodes. Reproductive: Intrauterine device appears appropriate in positioning. No suspicious adnexal mass. Other: Trace pelvic free fluid is  within physiologic normal limits. Musculoskeletal: No acute or significant osseous findings. IMPRESSION: 1. Mild wall thickening of an incompletely distended urinary bladder, correlate with urinalysis to exclude cystitis. 2. Cholelithiasis in a mildly distended gallbladder, suggest correlation with right upper quadrant tenderness to palpation and further evaluation with dedicated ultrasound if clinically indicated. 3. Punctate nonobstructive right lower pole renal stone. Electronically Signed   By: Dahlia Bailiff M.D.   On: 02/06/2022 12:51    Procedures Procedures    Medications Ordered in ED Medications  cefTRIAXone (ROCEPHIN) 2 g in sodium chloride 0.9 % 100 mL IVPB (has no administration in time range)  fentaNYL (SUBLIMAZE) injection 50 mcg (50 mcg Intravenous Given 02/06/22 1208)  ondansetron (ZOFRAN) injection 4 mg (4 mg Intravenous Given 02/06/22 1207)  sodium chloride 0.9 % bolus 1,000 mL (0 mLs Intravenous Stopped 02/06/22 1437)  iohexol (OMNIPAQUE) 300 MG/ML solution 100 mL (80 mLs Intravenous Contrast Given 02/06/22 1236)  morphine (PF) 4 MG/ML injection 4 mg (4 mg Intravenous Given 02/06/22 1311)  HYDROmorphone (DILAUDID) injection 1 mg (1 mg Intravenous Given 02/06/22 1436)  ED Course/ Medical Decision Making/ A&P                           Medical Decision Making Amount and/or Complexity of Data Reviewed Labs: ordered. Radiology: ordered.  Risk Prescription drug management. Decision regarding hospitalization.   Bonnie [name] is here with abdominal pain.  Nausea vomiting.  Normal vitals.  No fever.  No significant comorbidities.  Differential diagnosis is likely hyperemesis process versus constipation versus less likely bowel obstruction, cholecystitis, appendicitis.  She is fairly uncomfortable on exam but she has diffuse abdominal pain.  She denies any vaginal bleeding or vaginal discharge.  Seems less likely to be ovarian cyst or torsion.  We will get CBC, CMP, lipase,  urinalysis, urine pregnancy test, CT scan abdomen pelvis.  Will give IV Zofran, IV fluids and reevaluate.  She denies any recent alcohol or drug use.  Per chart review does have history of marijuana use.  Per my review and interpretation of labs is no significant anemia, electrolyte abnormality, kidney injury, leukocytosis.  CT scan concerning for cholecystitis/gallstones.  Right upper quadrant ultrasound was performed that showed gallstones with positive Murphy sign.  Overall suspect acute cholecystitis.  Talked with general surgery team who will admit for further care.  This chart was dictated using voice recognition software.  Despite best efforts to proofread,  errors can occur which can change the documentation meaning.         Final Clinical Impression(s) / ED Diagnoses Final diagnoses:  Cholecystitis, acute    Rx / DC Orders ED Discharge Orders     None         Virgina Norfolk, DO 02/06/22 1528

## 2022-02-06 NOTE — ED Notes (Signed)
Patient transported to CT 

## 2022-02-06 NOTE — ED Notes (Signed)
RT note: Pt. given I/S(Incentive Spirometry) and educated on use, indications and complications of not using prior to planned surgery, tolerated well.

## 2022-02-06 NOTE — ED Notes (Signed)
Patient transported to Ultrasound. RN to [place] to medicate

## 2022-02-06 NOTE — ED Triage Notes (Signed)
Pt to ED c/o abdominal pain that started this morning. Coming from PCP. Generalized central abdominal pain.

## 2022-02-06 NOTE — ED Notes (Addendum)
Report given to [facility]. 

## 2022-02-07 ENCOUNTER — Other Ambulatory Visit: Payer: Self-pay

## 2022-02-07 ENCOUNTER — Encounter (HOSPITAL_COMMUNITY)
Admission: EM | Disposition: A | Discharge status: Home / Self Care | Payer: Self-pay | Source: Home / Self Care | Attending: Emergency Medicine

## 2022-02-07 ENCOUNTER — Observation Stay (HOSPITAL_COMMUNITY): Payer: Federal, State, Local not specified - PPO | Admitting: Anesthesiology

## 2022-02-07 ENCOUNTER — Encounter (HOSPITAL_COMMUNITY): Payer: Self-pay

## 2022-02-07 DIAGNOSIS — K810 Acute cholecystitis: Secondary | ICD-10-CM | POA: Diagnosis not present

## 2022-02-07 DIAGNOSIS — K8000 Calculus of gallbladder with acute cholecystitis without obstruction: Secondary | ICD-10-CM | POA: Diagnosis not present

## 2022-02-07 DIAGNOSIS — G8918 Other acute postprocedural pain: Secondary | ICD-10-CM | POA: Diagnosis not present

## 2022-02-07 HISTORY — PX: CHOLECYSTECTOMY: SHX55

## 2022-02-07 LAB — COMPREHENSIVE METABOLIC PANEL
ALT: 15 U/L (ref 0–44)
AST: 17 U/L (ref 15–41)
Albumin: 3.3 g/dL — ABNORMAL LOW (ref 3.5–5.0)
Alkaline Phosphatase: 65 U/L (ref 38–126)
Anion gap: 11 (ref 5–15)
BUN: 5 mg/dL — ABNORMAL LOW (ref 6–20)
CO2: 23 mmol/L (ref 22–32)
Calcium: 8.9 mg/dL (ref 8.9–10.3)
Chloride: 104 mmol/L (ref 98–111)
Creatinine, Ser: 0.7 mg/dL (ref 0.44–1.00)
GFR, Estimated: >60 mL/min (ref >60)
Glucose, Bld: 93 mg/dL (ref 70–99)
Potassium: 3.7 mmol/L (ref 3.5–5.1)
Sodium: 138 mmol/L (ref 135–145)
Total Bilirubin: 0.7 mg/dL (ref 0.3–1.2)
Total Protein: 6.8 g/dL (ref 6.5–8.1)

## 2022-02-07 LAB — CBC
HCT: 40.6 % (ref 36.0–46.0)
Hemoglobin: 13.9 g/dL (ref 12.0–15.0)
MCH: 29.1 pg (ref 26.0–34.0)
MCHC: 34.2 g/dL (ref 30.0–36.0)
MCV: 84.9 fL (ref 80.0–100.0)
Platelets: 255 10*3/uL (ref 150–400)
RBC: 4.78 MIL/uL (ref 3.87–5.11)
RDW: 13.1 % (ref 11.5–15.5)
WBC: 8.9 10*3/uL (ref 4.0–10.5)
nRBC: 0 % (ref 0.0–0.2)

## 2022-02-07 SURGERY — LAPAROSCOPIC CHOLECYSTECTOMY
Anesthesia: General | Site: Abdomen

## 2022-02-07 MED ORDER — ZOLPIDEM TARTRATE 5 MG PO TABS
5.0000 mg | ORAL_TABLET | Freq: Every evening | ORAL | Status: DC | PRN
  Administered 2022-02-07: 5 mg via ORAL
  Filled 2022-02-07: qty 1

## 2022-02-07 MED ORDER — ROCURONIUM BROMIDE 10 MG/ML (PF) SYRINGE
PREFILLED_SYRINGE | INTRAVENOUS | Status: AC
  Filled 2022-02-07: qty 10

## 2022-02-07 MED ORDER — CHLORHEXIDINE GLUCONATE 0.12 % MT SOLN
OROMUCOSAL | Status: AC
  Administered 2022-02-07: 15 mL via OROMUCOSAL
  Filled 2022-02-07: qty 15

## 2022-02-07 MED ORDER — LACTATED RINGERS IV SOLN: INTRAVENOUS | Status: DC

## 2022-02-07 MED ORDER — DEXAMETHASONE SODIUM PHOSPHATE 10 MG/ML IJ SOLN
INTRAMUSCULAR | Status: AC
  Filled 2022-02-07: qty 1

## 2022-02-07 MED ORDER — FENTANYL CITRATE (PF) 100 MCG/2ML IJ SOLN: 50.0000 ug | Freq: Once | INTRAMUSCULAR | Status: AC

## 2022-02-07 MED ORDER — ORAL CARE MOUTH RINSE: 15.0000 mL | Freq: Once | OROMUCOSAL | Status: AC

## 2022-02-07 MED ORDER — CHLORHEXIDINE GLUCONATE 0.12 % MT SOLN: 15.0000 mL | Freq: Once | OROMUCOSAL | Status: AC

## 2022-02-07 MED ORDER — FENTANYL CITRATE (PF) 100 MCG/2ML IJ SOLN
INTRAMUSCULAR | Status: AC
  Filled 2022-02-07: qty 2

## 2022-02-07 MED ORDER — PROPOFOL 10 MG/ML IV BOLUS
INTRAVENOUS | Status: DC | PRN
  Administered 2022-02-07: 140 mg via INTRAVENOUS

## 2022-02-07 MED ORDER — SUGAMMADEX SODIUM 200 MG/2ML IV SOLN
INTRAVENOUS | Status: DC | PRN
  Administered 2022-02-07: 200 mg via INTRAVENOUS

## 2022-02-07 MED ORDER — BUPIVACAINE-EPINEPHRINE 0.25% -1:200000 IJ SOLN
INTRAMUSCULAR | Status: DC | PRN
  Administered 2022-02-07: 11 mL

## 2022-02-07 MED ORDER — PROPOFOL 10 MG/ML IV BOLUS
INTRAVENOUS | Status: AC
  Filled 2022-02-07: qty 20

## 2022-02-07 MED ORDER — MIDAZOLAM HCL 2 MG/2ML IJ SOLN
INTRAMUSCULAR | Status: AC
  Administered 2022-02-07: 2 mg
  Filled 2022-02-07: qty 2

## 2022-02-07 MED ORDER — MIDAZOLAM HCL 2 MG/2ML IJ SOLN: 0.5000 mg | Freq: Once | INTRAMUSCULAR | Status: DC

## 2022-02-07 MED ORDER — ONDANSETRON HCL 4 MG/2ML IJ SOLN
INTRAMUSCULAR | Status: AC
  Filled 2022-02-07: qty 2

## 2022-02-07 MED ORDER — FENTANYL CITRATE (PF) 100 MCG/2ML IJ SOLN
25.0000 ug | INTRAMUSCULAR | Status: DC | PRN
  Administered 2022-02-07 (×2): 50 ug via INTRAVENOUS

## 2022-02-07 MED ORDER — DEXAMETHASONE SODIUM PHOSPHATE 10 MG/ML IJ SOLN
INTRAMUSCULAR | Status: DC | PRN
  Administered 2022-02-07: 10 mg via INTRAVENOUS

## 2022-02-07 MED ORDER — FENTANYL CITRATE (PF) 100 MCG/2ML IJ SOLN
INTRAMUSCULAR | Status: AC
  Administered 2022-02-07: 50 ug via INTRAVENOUS
  Filled 2022-02-07: qty 2

## 2022-02-07 MED ORDER — LIDOCAINE 2% (20 MG/ML) 5 ML SYRINGE
INTRAMUSCULAR | Status: AC
  Filled 2022-02-07: qty 5

## 2022-02-07 MED ORDER — ROCURONIUM BROMIDE 10 MG/ML (PF) SYRINGE
PREFILLED_SYRINGE | INTRAVENOUS | Status: DC | PRN
  Administered 2022-02-07: 60 mg via INTRAVENOUS

## 2022-02-07 MED ORDER — BUPIVACAINE HCL (PF) 0.25 % IJ SOLN
INTRAMUSCULAR | Status: DC | PRN
  Administered 2022-02-07 (×2): 20 mL via PERINEURAL

## 2022-02-07 MED ORDER — SPY AGENT GREEN - (INDOCYANINE FOR INJECTION): 1.2500 mg | Freq: Once | INTRAMUSCULAR | Status: DC

## 2022-02-07 MED ORDER — ONDANSETRON HCL 4 MG/2ML IJ SOLN
INTRAMUSCULAR | Status: DC | PRN
  Administered 2022-02-07: 4 mg via INTRAVENOUS

## 2022-02-07 MED ORDER — INDOCYANINE GREEN 25 MG IV SOLR
INTRAVENOUS | Status: DC | PRN
  Administered 2022-02-07: 2.5 mg via INTRAVENOUS

## 2022-02-07 MED ORDER — FENTANYL CITRATE (PF) 250 MCG/5ML IJ SOLN
INTRAMUSCULAR | Status: DC | PRN
  Administered 2022-02-07 (×3): 50 ug via INTRAVENOUS

## 2022-02-07 MED ORDER — PROMETHAZINE HCL 25 MG/ML IJ SOLN: 6.2500 mg | INTRAMUSCULAR | Status: DC | PRN

## 2022-02-07 MED ORDER — LIDOCAINE 2% (20 MG/ML) 5 ML SYRINGE
INTRAMUSCULAR | Status: DC | PRN
  Administered 2022-02-07: 60 mg via INTRAVENOUS

## 2022-02-07 MED ORDER — MIDAZOLAM HCL 2 MG/2ML IJ SOLN
INTRAMUSCULAR | Status: AC
  Filled 2022-02-07: qty 2

## 2022-02-07 MED ORDER — SODIUM CHLORIDE 0.9 % IR SOLN
Status: DC | PRN
  Administered 2022-02-07: 1000 mL

## 2022-02-07 MED ORDER — OXYCODONE HCL 5 MG PO TABS: 5.0000 mg | ORAL_TABLET | Freq: Four times a day (QID) | ORAL | 0 refills | Status: DC | PRN

## 2022-02-07 MED ORDER — FENTANYL CITRATE (PF) 250 MCG/5ML IJ SOLN
INTRAMUSCULAR | Status: AC
  Filled 2022-02-07: qty 5

## 2022-02-07 MED ORDER — BUPIVACAINE LIPOSOME 1.3 % IJ SUSP
INTRAMUSCULAR | Status: DC | PRN
  Administered 2022-02-07 (×2): 5 mL via PERINEURAL

## 2022-02-07 MED ORDER — 0.9 % SODIUM CHLORIDE (POUR BTL) OPTIME
TOPICAL | Status: DC | PRN
  Administered 2022-02-07: 1000 mL

## 2022-02-07 SURGICAL SUPPLY — 44 items
ADH SKN CLS APL DERMABOND .7 (GAUZE/BANDAGES/DRESSINGS) ×1
APL PRP STRL LF DISP 70% ISPRP (MISCELLANEOUS) ×1
APPLIER CLIP 5 13 M/L LIGAMAX5 (MISCELLANEOUS) ×1
APR CLP MED LRG 5 ANG JAW (MISCELLANEOUS) ×1
BAG COUNTER SPONGE SURGICOUNT (BAG) ×1 IMPLANT
BAG SPEC RTRVL 10 TROC 200 (ENDOMECHANICALS) ×1
BAG SPNG CNTER NS LX DISP (BAG) ×1
BLADE CLIPPER SURG (BLADE) IMPLANT
CANISTER SUCT 3000ML PPV (MISCELLANEOUS) ×1 IMPLANT
CHLORAPREP W/TINT 26 (MISCELLANEOUS) ×1 IMPLANT
CLIP APPLIE 5 13 M/L LIGAMAX5 (MISCELLANEOUS) ×1 IMPLANT
COVER SURGICAL LIGHT HANDLE (MISCELLANEOUS) ×1 IMPLANT
DERMABOND ADVANCED .7 DNX12 (GAUZE/BANDAGES/DRESSINGS) ×1 IMPLANT
ELECT REM PT RETURN 9FT ADLT (ELECTROSURGICAL) ×1
ELECTRODE REM PT RTRN 9FT ADLT (ELECTROSURGICAL) ×1 IMPLANT
ENDOLOOP SUT PDS II  0 18 (SUTURE) ×2
ENDOLOOP SUT PDS II 0 18 (SUTURE) IMPLANT
GLOVE BIO SURGEON STRL SZ7 (GLOVE) ×1 IMPLANT
GLOVE BIOGEL PI IND STRL 7.5 (GLOVE) ×1 IMPLANT
GOWN STRL REUS W/ TWL LRG LVL3 (GOWN DISPOSABLE) ×3 IMPLANT
GOWN STRL REUS W/TWL LRG LVL3 (GOWN DISPOSABLE) ×3
GRASPER SUT TROCAR 14GX15 (MISCELLANEOUS) ×1 IMPLANT
KIT BASIN OR (CUSTOM PROCEDURE TRAY) ×1 IMPLANT
KIT TURNOVER KIT B (KITS) ×1 IMPLANT
NS IRRIG 1000ML POUR BTL (IV SOLUTION) ×1 IMPLANT
PAD ARMBOARD 7.5X6 YLW CONV (MISCELLANEOUS) ×1 IMPLANT
POUCH RETRIEVAL ECOSAC 10 (ENDOMECHANICALS) ×1 IMPLANT
POUCH RETRIEVAL ECOSAC 10MM (ENDOMECHANICALS) ×1
SCISSORS LAP 5X35 DISP (ENDOMECHANICALS) ×1 IMPLANT
SET IRRIG TUBING LAPAROSCOPIC (IRRIGATION / IRRIGATOR) ×1 IMPLANT
SET TUBE SMOKE EVAC HIGH FLOW (TUBING) ×1 IMPLANT
SLEEVE ENDOPATH XCEL 5M (ENDOMECHANICALS) ×2 IMPLANT
SLEEVE Z-THREAD 5X100MM (TROCAR) IMPLANT
SPECIMEN JAR SMALL (MISCELLANEOUS) ×1 IMPLANT
STRIP CLOSURE SKIN 1/2X4 (GAUZE/BANDAGES/DRESSINGS) ×1 IMPLANT
SUT MNCRL AB 4-0 PS2 18 (SUTURE) ×1 IMPLANT
SUT VICRYL 0 UR6 27IN ABS (SUTURE) ×1 IMPLANT
TOWEL GREEN STERILE (TOWEL DISPOSABLE) ×1 IMPLANT
TOWEL GREEN STERILE FF (TOWEL DISPOSABLE) ×1 IMPLANT
TRAY LAPAROSCOPIC MC (CUSTOM PROCEDURE TRAY) ×1 IMPLANT
TROCAR ADV FIXATION 12X100MM (TROCAR) IMPLANT
TROCAR XCEL BLUNT TIP 100MML (ENDOMECHANICALS) ×1 IMPLANT
TROCAR Z-THREAD OPTICAL 5X100M (TROCAR) ×1 IMPLANT
WATER STERILE IRR 1000ML POUR (IV SOLUTION) ×1 IMPLANT

## 2022-02-07 NOTE — Anesthesia Procedure Notes (Signed)
Procedure Name: Intubation Date/Time: 02/07/2022 12:33 PM  Performed by: Lieutenant Diego, CRNAPre-anesthesia Checklist: Patient identified, Emergency Drugs available, Suction available and Patient being monitored Patient Re-evaluated:Patient Re-evaluated prior to induction Oxygen Delivery Method: Circle system utilized Preoxygenation: Pre-oxygenation with 100% oxygen Induction Type: IV induction Ventilation: Mask ventilation without difficulty Laryngoscope Size: Miller and 2 Grade View: Grade II Tube type: Oral Tube size: 7.0 mm Number of attempts: 1 Airway Equipment and Method: Stylet and Oral airway Placement Confirmation: ETT inserted through vocal cords under direct vision, positive ETCO2 and breath sounds checked- equal and bilateral Secured at: 21 cm Tube secured with: Tape Dental Injury: Teeth and Oropharynx as per pre-operative assessment

## 2022-02-07 NOTE — H&P (Signed)
Bonnie [name] 1991-03-18  [id number].    Requesting MD: Dr. Virgina Norfolk  Chief Complaint/Reason for Consult: Acute Cholecystitis   HPI: Bonnie [name] is a 31 y.o. female with no significant medical hx who presented to the ED for abdominal pain. Reports she awoke around 5am with epigastric/ruq abdominal pain, n/v. Pain has been constant since onset. Pain medication has improved her pain to a 8/10. Nothing seems to make this worse including po intake. She denies hx of similar pain in the past. No fever, cp, sob, or change in bowel habits. Last bm 2d ago and normal. She did have some dysuria yesterday. Takes Mobic 2x/week for joint pain. No other nsaid use. No hx of ulcers. No prior abdominal surgeries. She is not on blood thinners. Vapes. Drinks socially (last etoh intake was Friday). No drug use. Works for [employer] in [employer] [place] and does heavy lifting. Lives at home with her mother and son.   WBC, Lipase and LFT's non-elevated. Preg test neg. UA negative. RUQ [place] with multiple gallstones measuring from 1-1.5cm and + murphy's sign concerning for acute cholecystitis. CBD 30mm.   ROS: ROS As above, see hpi  Family History  Problem Relation Age of Onset   Hypertension Mother    Hypertension Maternal Grandmother    Varicose Veins Maternal Grandmother     Past Medical History:  Diagnosis Date   Hx of chlamydia infection    Medical history non-contributory    Urinary tract infection    hx of    Past Surgical History:  Procedure Laterality Date   DILATION AND EVACUATION     I & D EXTREMITY Left 07/18/2012   Procedure: LEFT IRRIGATION AND DEBRIDEMENT AND REPAIR OF THE INDEX FINGER NERVES AND  TENDON ;  Surgeon: Dominica Severin, MD;  Location: [facility] OR;  Service: Orthopedics;  Laterality: Left;   TONSILLECTOMY     WISDOM TOOTH EXTRACTION      Social History:  reports that she has quit smoking. Her smoking use included cigars. She has a 0.50 pack-year smoking history.  She has never used smokeless tobacco. She reports current drug use. Drug: Marijuana. She reports that she does not drink alcohol.  Allergies: No Known Allergies  Medications Prior to Admission  Medication Sig Dispense Refill   ibuprofen (ADVIL) 200 MG tablet Take 400 mg by mouth every 6 (six) hours as needed for mild pain or moderate pain.     lidocaine (XYLOCAINE) 2 % solution 5 mLs every 6 (six) hours as needed.     Multiple Vitamin (MULTI-VITAMIN DAILY PO) Take 1 tablet by mouth as needed (for vitamin levels).     ciprofloxacin (CIPRO) 500 MG tablet Take 1 tablet (500 mg total) by mouth 2 (two) times daily. One po bid x 7 days (Patient not taking: Reported on 02/06/2022) 14 tablet 0   fluticasone (FLONASE) 50 MCG/ACT nasal spray Place into both nostrils.     HYDROcodone-acetaminophen (NORCO) 5-325 MG tablet Take 1 tablet by mouth every 4 (four) hours as needed for severe pain. (Patient not taking: Reported on 02/06/2022) 6 tablet 0   penicillin v potassium (VEETID) 500 MG tablet Take 500 mg by mouth 2 (two) times daily. (Patient not taking: Reported on 02/06/2022)       Physical Exam: Blood pressure 106/63, pulse 76, temperature 99 F (37.2 C), temperature source Oral, resp. rate 18, height 5\' 3"  (1.6 m), weight 88.9 kg, SpO2 97 %, unknown if currently breastfeeding. General: pleasant, WD/WN female who  is laying in bed in NAD HEENT: head is normocephalic, atraumatic.  Sclera are noninjected.  PERRL.  Ears and nose without any masses or lesions.  Mouth is pink and moist. Dentition fair Heart: regular, rate, and rhythm. Lungs: CTAB, no wheezes, rhonchi, or rales noted.  Respiratory effort nonlabored Abd:  Soft, mild distension, epigastric and ruq ttp with equivocal murphy's sign, +BS, no masses, hernias, or organomegaly MS: no BUE/BLE edema, calves soft and nontender Skin: warm and dry with no masses, lesions, or rashes Psych: A&Ox4 with an appropriate affect Neuro: cranial nerves grossly  intact, normal speech, thought process intact, moves all extremities, gait not assessed   Results for orders placed or performed during the hospital encounter of 02/06/22 (from the past 48 hour(s))  Lipase, blood     Status: Abnormal   Collection Time: 02/06/22 10:53 AM  Result Value Ref Range   Lipase <10 (L) 11 - 51 U/L    Comment: Performed at [facility], [address] [place], [place] [address]  Comprehensive metabolic panel     Status: Abnormal   Collection Time: 02/06/22 10:53 AM  Result Value Ref Range   Sodium 139 135 - 145 mmol/L   Potassium 4.1 3.5 - 5.1 mmol/L   Chloride 102 98 - 111 mmol/L   CO2 26 22 - 32 mmol/L   Glucose, Bld 116 (H) 70 - 99 mg/dL    Comment: Glucose reference range applies only to samples taken after fasting for at least 8 hours.   BUN 9 6 - 20 mg/dL   Creatinine, Ser 6.04 0.44 - 1.00 mg/dL   Calcium 54.0 8.9 - 98.1 mg/dL   Total Protein 8.4 (H) 6.5 - 8.1 g/dL   Albumin 4.6 3.5 - 5.0 g/dL   AST 15 15 - 41 U/L   ALT 14 0 - 44 U/L   Alkaline Phosphatase 61 38 - 126 U/L   Total Bilirubin 0.5 0.3 - 1.2 mg/dL   GFR, Estimated >19 >14 mL/min    Comment: (NOTE) Calculated using the CKD-EPI Creatinine Equation (2021)    Anion gap 11 5 - 15    Comment: Performed at [facility], [address] [place], [place] [address]  CBC     Status: Abnormal   Collection Time: 02/06/22 10:53 AM  Result Value Ref Range   WBC 8.5 4.0 - 10.5 K/uL   RBC 5.16 (H) 3.87 - 5.11 MIL/uL   Hemoglobin 14.8 12.0 - 15.0 g/dL   HCT 62.1 30.8 - 65.7 %   MCV 84.5 80.0 - 100.0 fL   MCH 28.7 26.0 - 34.0 pg   MCHC 33.9 30.0 - 36.0 g/dL   RDW 84.6 96.2 - 95.2 %   Platelets 286 150 - 400 K/uL   nRBC 0.0 0.0 - 0.2 %    Comment: Performed at [facility], [address] [place], [place] [address]  Urinalysis, Routine w reflex microscopic     Status: None   Collection Time: 02/06/22 11:50 AM  Result Value Ref Range    Color, Urine YELLOW YELLOW   APPearance CLEAR CLEAR   Specific Gravity, Urine 1.020 1.005 - 1.030   pH 7.5 5.0 - 8.0   Glucose, UA NEGATIVE NEGATIVE mg/dL   Hgb urine dipstick NEGATIVE NEGATIVE   Bilirubin Urine NEGATIVE NEGATIVE   Ketones, ur NEGATIVE NEGATIVE mg/dL   Protein, ur NEGATIVE NEGATIVE mg/dL   Nitrite NEGATIVE NEGATIVE   Leukocytes,Ua NEGATIVE NEGATIVE    Comment: Performed at [facility], [address] [place], [place] [address]  Pregnancy, urine     Status: None   Collection Time: 02/06/22 11:50 AM  Result Value Ref Range   Preg Test, Ur NEGATIVE NEGATIVE    Comment:        THE SENSITIVITY OF THIS METHODOLOGY IS >20 mIU/mL. Performed at [facility], [address] [place], [place] [address]    US Abdomen Limited RUQ (LIVER/GB)  Result Date: 02/06/2022 CLINICAL DATA:  RIGHT upper quadrant pain since 5 o'clock EXAM: ULTRASOUND ABDOMEN LIMITED RIGHT UPPER QUADRANT COMPARISON:  None Available. FINDINGS: Gallbladder: Multiple dependent gallstones. Stones measure from 1 to 1.5 cm. No gallbladder distension or pericholecystic fluid. Positive sonographic Murphy's sign. Common bile duct: Diameter: Normal at 3 mm Liver: No focal lesion identified. Within normal limits in parenchymal echogenicity. [name] vein is patent on color Doppler imaging with normal direction of blood flow towards the liver. Other: None. IMPRESSION: Cholelithiasis with positive sonographic Murphy's sign is concerning for acute cholecystitis. Consider nuclear medicine HIDA scan for confirmation. Electronically Signed   By: Genevive Bi M.D.   On: 02/06/2022 13:49   CT ABDOMEN PELVIS W CONTRAST  Result Date: 02/06/2022 CLINICAL DATA:  Centralized abdominal pain since this a.m. EXAM: CT ABDOMEN AND PELVIS WITH CONTRAST TECHNIQUE: Multidetector CT imaging of the abdomen and pelvis was performed using the standard protocol following bolus administration of intravenous  contrast. RADIATION DOSE REDUCTION: This exam was performed according to the departmental dose-optimization program which includes automated exposure control, adjustment of the mA and/or kV according to patient size and/or use of iterative reconstruction technique. CONTRAST:  24mL OMNIPAQUE IOHEXOL 300 MG/ML  SOLN COMPARISON:  None Available. FINDINGS: Lower chest: No acute abnormality. Hepatobiliary: No suspicious hepatic lesion. Cholelithiasis in a mildly distended gallbladder. No biliary ductal dilation. Pancreas: No pancreatic ductal dilation or evidence of acute inflammation. Spleen: No splenomegaly or focal splenic lesion. Adrenals/Urinary Tract: Bilateral adrenal glands appear normal. No hydronephrosis. Punctate nonobstructive right lower pole renal stone. Mild wall thickening of an incompletely distended urinary bladder. Stomach/Bowel: No radiopaque enteric contrast material was administered. Stomach is unremarkable for degree of distension. No pathologic dilation of small or large bowel. Normal appendix. No evidence of acute bowel inflammation. Vascular/Lymphatic: Normal caliber abdominal aorta. No pathologically enlarged abdominal or pelvic lymph nodes. Reproductive: Intrauterine device appears appropriate in positioning. No suspicious adnexal mass. Other: Trace pelvic free fluid is within physiologic normal limits. Musculoskeletal: No acute or significant osseous findings. IMPRESSION: 1. Mild wall thickening of an incompletely distended urinary bladder, correlate with urinalysis to exclude cystitis. 2. Cholelithiasis in a mildly distended gallbladder, suggest correlation with right upper quadrant tenderness to palpation and further evaluation with dedicated ultrasound if clinically indicated. 3. Punctate nonobstructive right lower pole renal stone. Electronically Signed   By: Maudry Mayhew M.D.   On: 02/06/2022 12:51    Anti-infectives (From admission, onward)    Start     Dose/Rate Route Frequency  Ordered Stop   02/07/22 1800  cefTRIAXone (ROCEPHIN) 2 g in sodium chloride 0.9 % 100 mL IVPB        2 g 200 mL/hr over 30 Minutes Intravenous Every 24 hours 02/06/22 1536 02/14/22 1759   02/06/22 1530  cefTRIAXone (ROCEPHIN) 2 g in sodium chloride 0.9 % 100 mL IVPB        2 g 200 mL/hr over 30 Minutes Intravenous  Once 02/06/22 1521 02/06/22 1808       Assessment/Plan Acute Cholecystitis Patient seen and examined.  Labs, vitals, I/O, imaging and notes reviewed. Discussed operative vs non-operative therapy. Recommend Laparoscopic Cholecystectomy. I have explained the procedure, risks, and aftercare (including lifting restrictions post op) of Laparoscopic cholecystectomy.  Risks include but are not limited to anesthesia (MI, CVA, death, prolonged intubation and aspiration), bleeding, infection, wound problems, hernia, bile leak, injury to common bile duct/liver/intestine, increased risk of DVT/PE, possible need for subtotal cholecystectomy, and diarrhea post op.  She seems to understand and agrees to proceed. Cont IV abx. Keep NPO. AM labs pending.   FEN - NPO, IVF VTE - SCDs, Lovenox ID - Rocephin Dispo - Admit to observation.   I reviewed nursing notes, ED provider notes, last 24 h vitals and pain scores, last 48 h intake and output, last 24 h labs and trends, and last 24 h imaging results.  Jillyn Ledger, [facility] 02/07/2022, 7:24 AM Please see Amion for pager number during day hours 7:00am-4:30pm

## 2022-02-07 NOTE — Op Note (Signed)
Preoperative diagnosis: acute cholecystitis Postoperative diagnosis: Same as above Procedure: Laparoscopic cholecystectomy Surgeon: Dr. Serita Grammes Anesthesia: General with bilateral tap block Estimated blood loss: Minimal Complications: None Drains: None Specimens: Gallbladder and contents to pathology Sponge needle count was correct x2 at end of operation Disposition recovery stable condition  Indications: This is a 31 year old otherwise healthy female who has had a less than 48 hours of right upper quadrant abdominal pain.  Her liver function tests and lipase are both normal.  She on ultrasound has multiple gallstones measuring about 1.5 cm and a sonographic Murphy sign.  On exam she had a Murphy sign and was tender.  We discussed proceeding with lap chole.  Procedure: After informed consent was obtained she was taken to the operating room.  She was injected with indocyanine green dye.  She underwent a tap block with anesthesia.  She was given antibiotics.  SCDs were placed.  She was placed under general anesthesia without complication.  She was prepped and draped in standard sterile surgical fashion.  Surgical timeout was then performed.  Infiltrated Marcaine below the umbilicus.  Made a vertical incision.  I grasped the fascia and incised it sharply.  I entered the peritoneum bluntly without injury.  I then placed a 0 Vicryl pursestring suture through the fascia.  I inserted a Hassan trocar and insufflated the abdomen to 15 mmHg pressure.  I then inserted 3 additional 5 mm trocars in the epigastrium and right upper quadrant.  The gallbladder was noted to have acute cholecystitis.  I bluntly remove the omentum from the gallbladder.  I had to aspirate the gallbladder just to be able to grasp it.  It was retracted cephalad and lateral.  She had a lot of inflammation in the triangle but eventually I was able to obtain a critical view of safety.  I confirmed with ICG dye that I was looking at the  common bile duct.  Her cystic duct had a number of stones in it.  I then clipped the artery 3 times and divided it leaving 2 clips in place.  I then decided to take the gallbladder off of the liver bed from it in a top-down fashion.  Once I was left with just the gallbladder and cystic duct attached to the common bile duct I used 2 Endoloops and placed these around the cystic duct.  I then divided this below where any of the stones were.  I removed all of the stones from the duct.  The gallbladder was then placed in a retrieval bag and removed from the abdomen.  Hemostasis was observed.  I irrigated and removed any remnants of stones.  I then remove the [facility] trocar and tied my pursestring down.  I had to make the incision bigger just to get the gallbladder out.  I placed an additional 2-0 Vicryl sutures using the suture passer device to completely better at that defect.  The remaining trocars were removed and abdomen desufflated.  These were closed with 4-0 Monocryl and glue.  She tolerated this well was extubated and transferred to recovery stable.

## 2022-02-07 NOTE — Anesthesia Preprocedure Evaluation (Addendum)
Anesthesia Evaluation  Patient identified by MRN, date of birth, ID band Patient awake    Reviewed: Allergy & Precautions, NPO status , Patient's Chart, lab work & pertinent test results  History of Anesthesia Complications Negative for: history of anesthetic complications  Airway Mallampati: II  TM Distance: >3 FB Neck ROM: Full    Dental  (+) Teeth Intact, Dental Advisory Given   Pulmonary Patient abstained from smoking., former smoker,    Pulmonary exam normal breath sounds clear to auscultation       Cardiovascular negative cardio ROS Normal cardiovascular exam Rhythm:Regular Rate:Normal     Neuro/Psych negative neurological ROS  negative psych ROS   GI/Hepatic negative GI ROS, Cholecystitis   Endo/Other  Obesity   Renal/GU negative Renal ROS     Musculoskeletal negative musculoskeletal ROS (+)   Abdominal   Peds  Hematology negative hematology ROS (+)   Anesthesia Other Findings Day of surgery medications reviewed with the patient.  Reproductive/Obstetrics                            Anesthesia Physical Anesthesia Plan  ASA: 2  Anesthesia Plan: General   Post-op Pain Management: Tylenol PO (pre-op)* and Regional block*   Induction: Intravenous  PONV Risk Score and Plan: 4 or greater and Midazolam, Dexamethasone and Ondansetron  Airway Management Planned: Oral ETT  Additional Equipment:   Intra-op Plan:   Post-operative Plan: Extubation in OR  Informed Consent: I have reviewed the patients History and Physical, chart, labs and discussed the procedure including the risks, benefits and alternatives for the proposed anesthesia with the patient or authorized representative who has indicated his/her understanding and acceptance.     Dental advisory given  Plan Discussed with: CRNA  Anesthesia Plan Comments:         Anesthesia Quick Evaluation

## 2022-02-07 NOTE — Discharge Instructions (Signed)
[facility] LAPAROSCOPIC SURGERY: POST OP INSTRUCTIONS  Always review your discharge instruction sheet given to you by the facility where your surgery was performed. IF YOU HAVE DISABILITY OR FAMILY LEAVE FORMS, YOU MUST BRING THEM TO THE OFFICE FOR PROCESSING.   DO NOT GIVE THEM TO YOUR DOCTOR.  A prescription for pain medication may be given to you upon discharge.  Take your pain medication as prescribed, if needed.  If narcotic pain medicine is not needed, then you may take acetaminophen (Tylenol), naprosyn (Alleve), or ibuprofen (Advil) as needed. Take your usually prescribed medications unless otherwise directed. If you need a refill on your pain medication, please contact your pharmacy.  They will contact our office to request authorization. Prescriptions will not be filled after 5pm or on week-ends. You should follow a light diet the first few days after arrival home, such as soup and crackers, etc.  Be sure to include lots of fluids daily. Most patients will experience some swelling and bruising in the area of the incisions.  Ice packs will help.  Swelling and bruising can take several days to resolve.  It is common to experience some constipation if taking pain medication after surgery.  Increasing fluid intake and taking a stool softener (such as Colace) will usually help or prevent this problem from occurring.  A mild laxative (Milk of Magnesia or Miralax) should be taken according to package instructions if there are no bowel movements after 48 hours. Unless discharge instructions indicate otherwise, you may remove your bandages 48 hours after surgery, and you may shower at that time.  You may have steri-strips (small skin tapes) in place directly over the incision.  These strips should be left on the skin for 7-10 days.  If your surgeon used skin glue on the incision, you may shower in 24 hours.  The glue will flake off over the next  2-3 weeks.  Any sutures or staples will be removed at the office during your follow-up visit. ACTIVITIES:  You may resume regular (light) daily activities beginning the next day--such as daily self-care, walking, climbing stairs--gradually increasing activities as tolerated.  You may have sexual intercourse when it is comfortable.  Refrain from any heavy lifting or straining until approved by your doctor. You may drive when you are no longer taking prescription pain medication, you can comfortably wear a seatbelt, and you can safely maneuver your car and apply brakes. RETURN TO WORK:  __________________________________________________________ You should see your doctor in the office for a follow-up appointment approximately 2-3 weeks after your surgery.  Make sure that you call for this appointment within a day or two after you arrive home to insure a convenient appointment time. OTHER INSTRUCTIONS: __________________________________________________________________________________________________________________________ __________________________________________________________________________________________________________________________ WHEN TO CALL YOUR DOCTOR: Fever over 101.0 Inability to urinate Continued bleeding from incision. Increased pain, redness, or drainage from the incision. Increasing abdominal pain  The clinic staff is available to answer your questions during regular business hours.  Please don't hesitate to call and ask to speak to one of the nurses for clinical concerns.  If you have a medical emergency, go to the nearest emergency room or call 911.  A surgeon from [facility] is always on call at the hospital. [address], [address], [place], [place]  [address] ? [address] [address], [place], [place]   [address] [phone] ? [phone] ? FAX [phone] Web site: [email or web address]  

## 2022-02-07 NOTE — Progress Notes (Signed)
Pt back from surgery complaining of 10/10 pain very tearful, pain medication given pt asked to go to BR and felt lightheaded, BSC brought to her.

## 2022-02-07 NOTE — Anesthesia Procedure Notes (Signed)
Anesthesia Regional Block: TAP block   Pre-Anesthetic Checklist: , timeout performed,  Correct Patient, Correct Site, Correct Laterality,  Correct Procedure, Correct Position, site marked,  Risks and benefits discussed,  Surgical consent,  Pre-op evaluation,  At surgeon's request and post-op pain management  Laterality: Right  Prep: chloraprep       Needles:  Injection technique: Single-shot  Needle Type: Echogenic Needle     Needle Length: 9cm  Needle Gauge: 21     Additional Needles:   Procedures:,,,, ultrasound used (permanent image in chart),,    Narrative:  Start time: 02/07/2022 12:13 PM End time: 02/07/2022 12:19 PM Injection made incrementally with aspirations every 5 mL.  Performed by: Personally  Anesthesiologist: Santa Lighter, MD  Additional Notes: No pain on injection. No increased resistance to injection. Injection made in 5cc increments.  Good needle visualization.  Patient tolerated procedure well.

## 2022-02-07 NOTE — Anesthesia Procedure Notes (Signed)
Anesthesia Regional Block: TAP block   Pre-Anesthetic Checklist: , timeout performed,  Correct Patient, Correct Site, Correct Laterality,  Correct Procedure, Correct Position, site marked,  Risks and benefits discussed,  Surgical consent,  Pre-op evaluation,  At surgeon's request and post-op pain management  Laterality: Left  Prep: chloraprep       Needles:  Injection technique: Single-shot  Needle Type: Echogenic Needle     Needle Length: 9cm  Needle Gauge: 21     Additional Needles:   Procedures:,,,, ultrasound used (permanent image in chart),,    Narrative:  Start time: 02/07/2022 12:07 PM End time: 02/07/2022 12:13 PM Injection made incrementally with aspirations every 5 mL.  Performed by: Personally  Anesthesiologist: Santa Lighter, MD  Additional Notes: No pain on injection. No increased resistance to injection. Injection made in 5cc increments.  Good needle visualization.  Patient tolerated procedure well.

## 2022-02-07 NOTE — Transfer of Care (Signed)
Immediate Anesthesia Transfer of Care Note  Patient: Bonnie [name]  Procedure(s) Performed: LAPAROSCOPIC CHOLECYSTECTOMY WITH ICG (Abdomen) INDOCYANINE GREEN FLUORESCENCE IMAGING (ICG) (Abdomen)  Patient Location: PACU  Anesthesia Type:General  Level of Consciousness: awake and alert   Airway & Oxygen Therapy: Patient Spontanous Breathing and Patient connected to face mask oxygen  Post-op Assessment: Report given to RN and Post -op Vital signs reviewed and stable  Post vital signs: Reviewed and stable  Last Vitals:  Vitals Value Taken Time  BP 137/80 02/07/22 1345  Temp    Pulse 76 02/07/22 1346  Resp 12 02/07/22 1346  SpO2 94 % 02/07/22 1346  Vitals shown include unvalidated device data.  Last Pain:  Vitals:   02/07/22 1148  TempSrc: Oral  PainSc: 7       Patients Stated Pain Goal: 0 (92/92/44 6286)  Complications: No notable events documented.

## 2022-02-08 ENCOUNTER — Encounter (HOSPITAL_COMMUNITY): Payer: Self-pay | Admitting: General Surgery

## 2022-02-08 LAB — SURGICAL PATHOLOGY

## 2022-02-08 MED ORDER — METHOCARBAMOL 500 MG PO TABS
1000.0000 mg | ORAL_TABLET | Freq: Three times a day (TID) | ORAL | Status: DC
  Administered 2022-02-08: 1000 mg via ORAL
  Filled 2022-02-08: qty 2

## 2022-02-08 MED ORDER — ACETAMINOPHEN 500 MG PO TABS
1000.0000 mg | ORAL_TABLET | Freq: Four times a day (QID) | ORAL | Status: DC | PRN
  Administered 2022-02-08: 1000 mg via ORAL
  Filled 2022-02-08: qty 2

## 2022-02-08 MED ORDER — KETOROLAC TROMETHAMINE 30 MG/ML IJ SOLN
30.0000 mg | Freq: Three times a day (TID) | INTRAMUSCULAR | Status: DC
  Administered 2022-02-08: 30 mg via INTRAVENOUS
  Filled 2022-02-08: qty 1

## 2022-02-08 MED ORDER — ACETAMINOPHEN 500 MG PO TABS: 1000.0000 mg | ORAL_TABLET | Freq: Three times a day (TID) | ORAL | 0 refills | Status: DC | PRN

## 2022-02-08 NOTE — Progress Notes (Signed)
Discussed pain management with pt and father, will try Oxy now with tylenol, wait 20 min and then pt is to ambulate in the hallway with assistance. Explained how ambulation will actually work out some of the soreness. Pt will order some food and see how that goes, gave Mylicon for gas. Will continue to evaluate pain and activity.

## 2022-02-08 NOTE — Anesthesia Postprocedure Evaluation (Signed)
Anesthesia Post Note  Patient: Ivylynn A Maulden  Procedure(s) Performed: LAPAROSCOPIC CHOLECYSTECTOMY WITH ICG (Abdomen) INDOCYANINE GREEN FLUORESCENCE IMAGING (ICG) (Abdomen)     Patient location during evaluation: PACU Anesthesia Type: General Level of consciousness: awake and alert Pain management: pain level controlled Vital Signs Assessment: post-procedure vital signs reviewed and stable Respiratory status: spontaneous breathing, nonlabored ventilation, respiratory function stable and patient connected to nasal cannula oxygen Cardiovascular status: blood pressure returned to baseline and stable Postop Assessment: no apparent nausea or vomiting Anesthetic complications: no   No notable events documented.  Last Vitals:  Vitals:   02/07/22 1938 02/08/22 0500  BP: 138/84 128/70  Pulse: (!) 101 74  Resp: 17 17  Temp: 36.7 C 36.9 C  SpO2: 96% 95%    Last Pain:  Vitals:   02/08/22 0703  TempSrc:   PainSc: 0-No pain                 Santa Lighter

## 2022-02-08 NOTE — Progress Notes (Signed)
  Transition of Care (TOC) Screening Note   Patient Details  Name: Adalaide A Sebastiani Date of Birth: [birth year]   Transition of Care [facility]) CM/SW Contact:    Bartholomew Crews, RN Phone Number: [phone] 02/08/2022, 8:29 AM  DC pending pain management and tolerating PO  Transition of Care Department [facility]) has reviewed patient and no TOC needs have been identified at this time. We will continue to monitor patient advancement through interdisciplinary progression rounds. If new patient transition needs arise, please place a TOC consult.

## 2022-02-08 NOTE — Progress Notes (Signed)
1 Day Post-Op  Subjective: CC: Notes pain around her incisions that is worse with movement. Pain in right shoulder and feels bloated but passing flatus without n/v. No currently cp or sob. Tolerating some po but not much of an appetite. Still taking IV pain medications. Mobilized in the halls this am. Voiding.   Objective: Vital signs in last 24 hours: Temp:  [97.8 F (36.6 C)-98.6 F (37 C)] 98.3 F (36.8 C) (10/04 0803) Pulse Rate:  [68-101] 82 (10/04 0803) Resp:  [10-20] 18 (10/04 0803) BP: (113-138)/(65-88) 130/79 (10/04 0803) SpO2:  [92 %-97 %] 96 % (10/04 0803) Last BM Date : 02/06/22  Intake/Output from previous day: 10/03 0701 - 10/04 0700 In: 720 [P.O.:120; I.V.:500; IV Piggyback:100] Out: -  Intake/Output this shift: No intake/output data recorded.  PE: Gen:  Alert, NAD, pleasant Card:  RRR Pulm:  CTAB, no W/R/R, effort normal Abd: Soft, mild distension, tender around her incisions, +BS, incisions with glue and steristrips intact appears well and are without drainage, bleeding, or signs of infection Ext:  No LE edema  Psych: A&Ox3   Lab Results:  Recent Labs    02/06/22 1053 02/07/22 0858  WBC 8.5 8.9  HGB 14.8 13.9  HCT 43.6 40.6  PLT 286 255   BMET Recent Labs    02/06/22 1053 02/07/22 0858  NA 139 138  K 4.1 3.7  CL 102 104  CO2 26 23  GLUCOSE 116* 93  BUN 9 5*  CREATININE 0.77 0.70  CALCIUM 10.0 8.9   PT/INR No results for input(s): "LABPROT", "INR" in the last 72 hours. CMP     Component Value Date/Time   NA 138 02/07/2022 0858   K 3.7 02/07/2022 0858   CL 104 02/07/2022 0858   CO2 23 02/07/2022 0858   GLUCOSE 93 02/07/2022 0858   BUN 5 (L) 02/07/2022 0858   CREATININE 0.70 02/07/2022 0858   CALCIUM 8.9 02/07/2022 0858   PROT 6.8 02/07/2022 0858   ALBUMIN 3.3 (L) 02/07/2022 0858   AST 17 02/07/2022 0858   ALT 15 02/07/2022 0858   ALKPHOS 65 02/07/2022 0858   BILITOT 0.7 02/07/2022 0858   GFRNONAA >60 02/07/2022 0858    GFRAA >60 09/06/2017 2235   Lipase     Component Value Date/Time   LIPASE <10 (L) 02/06/2022 1053    Studies/Results: US Abdomen Limited RUQ (LIVER/GB)  Result Date: 02/06/2022 CLINICAL DATA:  RIGHT upper quadrant pain since 5 o'clock EXAM: ULTRASOUND ABDOMEN LIMITED RIGHT UPPER QUADRANT COMPARISON:  None Available. FINDINGS: Gallbladder: Multiple dependent gallstones. Stones measure from 1 to 1.5 cm. No gallbladder distension or pericholecystic fluid. Positive sonographic Murphy's sign. Common bile duct: Diameter: Normal at 3 mm Liver: No focal lesion identified. Within normal limits in parenchymal echogenicity. Portal vein is patent on color Doppler imaging with normal direction of blood flow towards the liver. Other: None. IMPRESSION: Cholelithiasis with positive sonographic Murphy's sign is concerning for acute cholecystitis. Consider nuclear medicine HIDA scan for confirmation. Electronically Signed   By: Suzy Bouchard M.D.   On: 02/06/2022 13:49   CT ABDOMEN PELVIS W CONTRAST  Result Date: 02/06/2022 CLINICAL DATA:  Centralized abdominal pain since this a.m. EXAM: CT ABDOMEN AND PELVIS WITH CONTRAST TECHNIQUE: Multidetector CT imaging of the abdomen and pelvis was performed using the standard protocol following bolus administration of intravenous contrast. RADIATION DOSE REDUCTION: This exam was performed according to the departmental dose-optimization program which includes automated exposure control, adjustment of the mA and/or kV  according to patient size and/or use of iterative reconstruction technique. CONTRAST:  66mL OMNIPAQUE IOHEXOL 300 MG/ML  SOLN COMPARISON:  None Available. FINDINGS: Lower chest: No acute abnormality. Hepatobiliary: No suspicious hepatic lesion. Cholelithiasis in a mildly distended gallbladder. No biliary ductal dilation. Pancreas: No pancreatic ductal dilation or evidence of acute inflammation. Spleen: No splenomegaly or focal splenic lesion. Adrenals/Urinary  Tract: Bilateral adrenal glands appear normal. No hydronephrosis. Punctate nonobstructive right lower pole renal stone. Mild wall thickening of an incompletely distended urinary bladder. Stomach/Bowel: No radiopaque enteric contrast material was administered. Stomach is unremarkable for degree of distension. No pathologic dilation of small or large bowel. Normal appendix. No evidence of acute bowel inflammation. Vascular/Lymphatic: Normal caliber abdominal aorta. No pathologically enlarged abdominal or pelvic lymph nodes. Reproductive: Intrauterine device appears appropriate in positioning. No suspicious adnexal mass. Other: Trace pelvic free fluid is within physiologic normal limits. Musculoskeletal: No acute or significant osseous findings. IMPRESSION: 1. Mild wall thickening of an incompletely distended urinary bladder, correlate with urinalysis to exclude cystitis. 2. Cholelithiasis in a mildly distended gallbladder, suggest correlation with right upper quadrant tenderness to palpation and further evaluation with dedicated ultrasound if clinically indicated. 3. Punctate nonobstructive right lower pole renal stone. Electronically Signed   By: Maudry Mayhew M.D.   On: 02/06/2022 12:51    Anti-infectives: Anti-infectives (From admission, onward)    Start     Dose/Rate Route Frequency Ordered Stop   02/07/22 1800  cefTRIAXone (ROCEPHIN) 2 g in sodium chloride 0.9 % 100 mL IVPB        2 g 200 mL/hr over 30 Minutes Intravenous Every 24 hours 02/06/22 1536 02/14/22 1759   02/06/22 1530  cefTRIAXone (ROCEPHIN) 2 g in sodium chloride 0.9 % 100 mL IVPB        2 g 200 mL/hr over 30 Minutes Intravenous  Once 02/06/22 1521 02/06/22 1808        Assessment/Plan POD 1 s/p Laparoscopic Cholecystectomy by Dr. Dwain Sarna on 02/07/22 for Acute Cholecystitis  - Adv diet - Mobilize in halls - Pulm toilet - Multimodal pain control - schedule tylenol, toradol and robaxin. Add ice pack. PRN oxy.  - If pain is well  controlled this pm and she is tolerating a diet, will plan d/c today. Discussed discharge instructions, restrictions and return/call back precautions. Will give note for work, she says she has an option at work for [employer] duty with lifting restrictions outlined.   FEN - Reg, SLIV VTE - SCDs, Lovenox  ID - Rocephin (plan to stop abx at d/c)   LOS: 0 days    Jacinto Halim , [facility] 02/08/2022, 10:21 AM Please see Amion for pager number during day hours 7:00am-4:30pm

## 2022-02-08 NOTE — Progress Notes (Signed)
Discharge instructions and medications reviewed with patient, patient verbalized understanding. Iv removed. Worked note given to patient. Patient discharged

## 2022-02-10 NOTE — Discharge Summary (Signed)
Patient ID: Bonnie [name] [id number] 1991-04-06 31 y.o.  Admit date: 02/06/2022 Discharge date: 02/10/2022  Discharge Diagnosis Acute Cholecystitis s/p Laparoscopic Cholecystectomy by Dr. Donne Hazel on 02/07/22   Consultants None  Reason for Admission: Bonnie [name] is a 31 y.o. female with no significant medical hx who presented to the ED for abdominal pain. Reports she awoke around 5am with epigastric/ruq abdominal pain, n/v. Pain has been constant since onset. Pain medication has improved her pain to a 8/10. Nothing seems to make this worse including po intake. She denies hx of similar pain in the past. No fever, cp, sob, or change in bowel habits. Last bm 2d ago and normal. She did have some dysuria yesterday. Takes Mobic 2x/week for joint pain. No other nsaid use. No hx of ulcers. No prior abdominal surgeries. She is not on blood thinners. Vapes. Drinks socially (last etoh intake was Friday). No drug use. Works for [employer] in [employer] and does heavy lifting. Lives at home with her mother and son.    WBC, Lipase and LFT's non-elevated. Preg test neg. UA negative. RUQ [place] with multiple gallstones measuring from 1-1.5cm and + murphy's sign concerning for acute cholecystitis. CBD 7mm.     Procedures Dr. Donne Hazel - 02/07/22 -  Laparoscopic Cholecystectomy   Hospital Course:  The patient was admitted and underwent a laparoscopic cholecystectomy.  The patient tolerated the procedure well.  On POD 1, the patient was tolerating a diet, voiding well, mobilizing, and pain was controlled.  The patient was stable for DC home at this time with appropriate follow up made. Discussed discharge instructions, restrictions and return/call back precautions. Follow up being arranged.   Allergies as of 02/08/2022   No Known Allergies      Medication List     STOP taking these medications    HYDROcodone-acetaminophen 5-325 MG tablet Commonly known as: Norco       TAKE  these medications    acetaminophen 500 MG tablet Commonly known as: TYLENOL Take 2 tablets (1,000 mg total) by mouth every 8 (eight) hours as needed.   ciprofloxacin 500 MG tablet Commonly known as: Cipro Take 1 tablet (500 mg total) by mouth 2 (two) times daily. One po bid x 7 days   fluticasone 50 MCG/ACT nasal spray Commonly known as: FLONASE Place into both nostrils.   ibuprofen 200 MG tablet Commonly known as: ADVIL Take 400 mg by mouth every 6 (six) hours as needed for mild pain or moderate pain.   lidocaine 2 % solution Commonly known as: XYLOCAINE 5 mLs every 6 (six) hours as needed.   MULTI-VITAMIN DAILY PO Take 1 tablet by mouth as needed (for vitamin levels).   oxyCODONE 5 MG immediate release tablet Commonly known as: Oxy IR/ROXICODONE Take 1 tablet (5 mg total) by mouth every 6 (six) hours as needed for moderate pain, severe pain or breakthrough pain.   penicillin v potassium 500 MG tablet Commonly known as: VEETID Take 500 mg by mouth 2 (two) times daily.          Follow-up Information     Felicita Nuncio, Carlena Hurl, PA-C Follow up.   Specialty: General Surgery Why: our office is scheduling you for post-operative follow up in about 3 weeks. please call to confirm appointment date/time. Contact information: [place] [address] [phone]                 Signed: Alferd Apa, [facility] 02/10/2022, 3:05  PM Please see Amion for pager number during day hours 7:00am-4:30pm

## 2022-04-27 ENCOUNTER — Other Ambulatory Visit (HOSPITAL_COMMUNITY)
Admission: RE | Admit: 2022-04-27 | Discharge: 2022-04-27 | Disposition: A | Discharge status: Home / Self Care | Payer: Federal, State, Local not specified - PPO | Source: Ambulatory Visit | Attending: Obstetrics and Gynecology | Admitting: Obstetrics and Gynecology

## 2022-04-27 ENCOUNTER — Other Ambulatory Visit: Payer: Self-pay | Admitting: Obstetrics and Gynecology

## 2022-04-27 DIAGNOSIS — Z7251 High risk heterosexual behavior: Secondary | ICD-10-CM | POA: Diagnosis not present

## 2022-04-27 DIAGNOSIS — N63 Unspecified lump in unspecified breast: Secondary | ICD-10-CM

## 2022-04-27 DIAGNOSIS — Z01419 Encounter for gynecological examination (general) (routine) without abnormal findings: Secondary | ICD-10-CM | POA: Diagnosis not present

## 2022-05-03 LAB — CYTOLOGY - PAP
Chlamydia: NEGATIVE
Comment: NEGATIVE
Comment: NORMAL
Diagnosis: NEGATIVE
Neisseria Gonorrhea: NEGATIVE

## 2022-05-15 DIAGNOSIS — N6001 Solitary cyst of right breast: Secondary | ICD-10-CM | POA: Diagnosis not present

## 2022-05-15 DIAGNOSIS — N644 Mastodynia: Secondary | ICD-10-CM | POA: Diagnosis not present

## 2022-05-25 ENCOUNTER — Other Ambulatory Visit: Payer: Self-pay

## 2022-05-25 DIAGNOSIS — N6011 Diffuse cystic mastopathy of right breast: Secondary | ICD-10-CM | POA: Diagnosis not present

## 2022-05-25 DIAGNOSIS — N6314 Unspecified lump in the right breast, lower inner quadrant: Secondary | ICD-10-CM | POA: Diagnosis not present

## 2022-06-05 DIAGNOSIS — Z114 Encounter for screening for human immunodeficiency virus [HIV]: Secondary | ICD-10-CM | POA: Diagnosis not present

## 2022-06-05 DIAGNOSIS — Z113 Encounter for screening for infections with a predominantly sexual mode of transmission: Secondary | ICD-10-CM | POA: Diagnosis not present

## 2022-06-12 DIAGNOSIS — Z0000 Encounter for general adult medical examination without abnormal findings: Secondary | ICD-10-CM | POA: Diagnosis not present

## 2022-06-12 DIAGNOSIS — F419 Anxiety disorder, unspecified: Secondary | ICD-10-CM | POA: Diagnosis not present

## 2022-06-12 DIAGNOSIS — F3341 Major depressive disorder, recurrent, in partial remission: Secondary | ICD-10-CM | POA: Diagnosis not present

## 2022-06-12 DIAGNOSIS — N62 Hypertrophy of breast: Secondary | ICD-10-CM | POA: Diagnosis not present

## 2022-06-12 DIAGNOSIS — E782 Mixed hyperlipidemia: Secondary | ICD-10-CM | POA: Diagnosis not present

## 2022-06-13 ENCOUNTER — Other Ambulatory Visit: Payer: Federal, State, Local not specified - PPO

## 2022-08-07 DIAGNOSIS — N62 Hypertrophy of breast: Secondary | ICD-10-CM | POA: Diagnosis not present

## 2022-08-08 ENCOUNTER — Encounter: Payer: Self-pay | Admitting: Plastic Surgery

## 2022-08-08 ENCOUNTER — Ambulatory Visit: Payer: Federal, State, Local not specified - PPO | Admitting: Plastic Surgery

## 2022-08-08 ENCOUNTER — Telehealth: Payer: Self-pay | Admitting: Plastic Surgery

## 2022-08-08 VITALS — BP 123/82 | HR 62 | Ht 64.0 in | Wt 189.6 lb

## 2022-08-08 DIAGNOSIS — M545 Low back pain, unspecified: Secondary | ICD-10-CM | POA: Diagnosis not present

## 2022-08-08 DIAGNOSIS — Z113 Encounter for screening for infections with a predominantly sexual mode of transmission: Secondary | ICD-10-CM | POA: Diagnosis not present

## 2022-08-08 DIAGNOSIS — M546 Pain in thoracic spine: Secondary | ICD-10-CM

## 2022-08-08 DIAGNOSIS — M542 Cervicalgia: Secondary | ICD-10-CM | POA: Diagnosis not present

## 2022-08-08 DIAGNOSIS — N898 Other specified noninflammatory disorders of vagina: Secondary | ICD-10-CM | POA: Diagnosis not present

## 2022-08-08 DIAGNOSIS — N62 Hypertrophy of breast: Secondary | ICD-10-CM | POA: Diagnosis not present

## 2022-08-08 DIAGNOSIS — Z6832 Body mass index (BMI) 32.0-32.9, adult: Secondary | ICD-10-CM

## 2022-08-08 DIAGNOSIS — M549 Dorsalgia, unspecified: Secondary | ICD-10-CM | POA: Insufficient documentation

## 2022-08-08 DIAGNOSIS — G8929 Other chronic pain: Secondary | ICD-10-CM

## 2022-08-08 NOTE — Progress Notes (Signed)
Patient ID: Bonnie [name], female    DOB: 1990-05-28, 32 y.o.   MRN: [id number]   Chief Complaint  Patient presents with   Consult   Breast Problem    Mammary Hyperplasia: The patient is a 32 y.o. female with a history of mammary hyperplasia for several years.  She has extremely large breasts causing symptoms that include the following: Back pain in the upper and lower back, including neck pain. She pulls or pins her bra straps to provide better lift and relief of the pressure and pain. She notices relief by holding her breast up manually.  Her shoulder straps cause grooves and pain and pressure that requires padding for relief. Pain medication is sometimes required with motrin and tylenol.  Activities that are hindered by enlarged breasts include: exercise and running.  She has tried supportive clothing as well as fitted bras without improvement.  Her breasts are extremely large and fairly symmetric.  She has hyperpigmentation of the inframammary area on both sides.  The sternal to nipple distance on the right is 31 cm and the left is 31 cm.  The IMF distance is 14 cm.  She is 5 feet 4 inches tall and weighs 191 pounds.  The BMI = 32.8 kg/m.  Preoperative bra size = F cup. She would like to be a C/D cup. The estimated excess breast tissue to be removed at the time of surgery = 630 grams on the left and 630 grams on the right.  Mammogram history: None.  Family history of breast cancer: None.  Tobacco use: None.   The patient expresses the desire to pursue surgical intervention.  Patient had a cholecystectomy about a year ago without complications.  Patient said she breast-fed her baby for 6 months but that it was difficult.  Her last hemoglobin A1c was December 2022 and was 5.5.     Review of Systems  Constitutional: Negative.   HENT: Negative.    Eyes: Negative.   Respiratory: Negative.  Negative for chest tightness and shortness of breath.   Cardiovascular: Negative.    Gastrointestinal: Negative.   Endocrine: Negative.   Genitourinary: Negative.   Musculoskeletal:  Positive for back pain and neck pain.    Past Medical History:  Diagnosis Date   Hx of chlamydia infection    Medical history non-contributory    Urinary tract infection    hx of    Past Surgical History:  Procedure Laterality Date   CHOLECYSTECTOMY N/A 02/07/2022   Procedure: LAPAROSCOPIC CHOLECYSTECTOMY WITH ICG;  Surgeon: Rolm Bookbinder, MD;  Location: [place];  Service: General;  Laterality: N/A;   DILATION AND EVACUATION     I & D EXTREMITY Left 07/18/2012   Procedure: LEFT IRRIGATION AND DEBRIDEMENT AND REPAIR OF THE INDEX FINGER NERVES AND  TENDON ;  Surgeon: Roseanne Kaufman, MD;  Location: [place];  Service: Orthopedics;  Laterality: Left;   TONSILLECTOMY     WISDOM TOOTH EXTRACTION        Current Outpatient Medications:    acetaminophen (TYLENOL) 500 MG tablet, Take 2 tablets (1,000 mg total) by mouth every 8 (eight) hours as needed., Disp: 30 tablet, Rfl: 0   fluticasone (FLONASE) 50 MCG/ACT nasal spray, Place into both nostrils., Disp: , Rfl:    ibuprofen (ADVIL) 200 MG tablet, Take 400 mg by mouth every 6 (six) hours as needed for mild pain or moderate pain., Disp: , Rfl:    ciprofloxacin (CIPRO) 500 MG tablet, Take 1 tablet (500  mg total) by mouth 2 (two) times daily. One po bid x 7 days, Disp: 14 tablet, Rfl: 0   lidocaine (XYLOCAINE) 2 % solution, 5 mLs every 6 (six) hours as needed., Disp: , Rfl:    Multiple Vitamin (MULTI-VITAMIN DAILY PO), Take 1 tablet by mouth as needed (for vitamin levels)., Disp: , Rfl:    oxyCODONE (OXY IR/ROXICODONE) 5 MG immediate release tablet, Take 1 tablet (5 mg total) by mouth every 6 (six) hours as needed for moderate pain, severe pain or breakthrough pain., Disp: 10 tablet, Rfl: 0   penicillin v potassium (VEETID) 500 MG tablet, Take 500 mg by mouth 2 (two) times daily. (Patient not taking: Reported on 02/06/2022), Disp: , Rfl:     Objective:   Vitals:   08/08/22 1124  BP: 123/82  Pulse: 62  SpO2: 97%    Physical Exam Vitals reviewed.  Constitutional:      Appearance: Normal appearance.  HENT:     Head: Atraumatic.  Cardiovascular:     Rate and Rhythm: Normal rate.     Pulses: Normal pulses.  Pulmonary:     Effort: Pulmonary effort is normal.  Abdominal:     Palpations: Abdomen is soft.  Skin:    General: Skin is warm.     Capillary Refill: Capillary refill takes less than 2 seconds.     Coloration: Skin is not jaundiced.     Findings: No bruising.  Neurological:     Mental Status: She is alert and oriented to person, place, and time.  Psychiatric:        Mood and Affect: Mood normal.        Behavior: Behavior normal.        Thought Content: Thought content normal.        Judgment: Judgment normal.     Assessment & Plan:  Chronic bilateral thoracic back pain  Symptomatic mammary hypertrophy  The procedure the patient selected and that was best for the patient was discussed. The risk were discussed and include but not limited to the following:  Breast asymmetry, fluid accumulation, firmness of the breast, inability to breast feed, loss of nipple or areola, skin loss, change in skin and nipple sensation, fat necrosis of the breast tissue, bleeding, infection and healing delay.  There are risks of anesthesia and injury to nerves or blood vessels.  Allergic reaction to tape, suture and skin glue are possible.  There will be swelling.  Any of these can lead to the need for revisional surgery.  A breast reduction has potential to interfere with diagnostic procedures in the future.  This procedure is best done when the breast is fully developed.  Changes in the breast will continue to occur over time: pregnancy, weight gain or weigh loss. No guarantees are given for a specific bra or breast size.    Total time: 40 minutes. This includes time spent with the patient during the visit as well as time spent  before and after the visit reviewing the chart, documenting the encounter, ordering pertinent studies and literature for the patient.   Physical therapy: Will be needed Mammogram: Not indicated   Patient is a good candidate for bilateral breast reduction.  She has also seen Dr. Harlow Mares and will decide on how she would like to proceed.  I encouraged her that Dr. Harlow Mares is an excellent surgeon.  She will let us know what she decides.  Pictures were obtained of the patient and placed in the chart with the  patient's or guardian's permission.   [place], DO

## 2022-08-08 NOTE — Telephone Encounter (Signed)
Pt just called and is not going to move forward with sx here, pt will be going with another provider, pt had a consult today for breast reduction, back and shoulder pain

## 2022-08-08 NOTE — Addendum Note (Signed)
Addended by: Wallace Going on: 08/08/2022 12:59 PM   Modules accepted: Orders

## 2022-08-09 DIAGNOSIS — Z20822 Contact with and (suspected) exposure to covid-19: Secondary | ICD-10-CM | POA: Diagnosis not present

## 2022-08-29 DIAGNOSIS — N898 Other specified noninflammatory disorders of vagina: Secondary | ICD-10-CM | POA: Diagnosis not present

## 2022-08-29 DIAGNOSIS — Z113 Encounter for screening for infections with a predominantly sexual mode of transmission: Secondary | ICD-10-CM | POA: Diagnosis not present

## 2022-09-13 DIAGNOSIS — J069 Acute upper respiratory infection, unspecified: Secondary | ICD-10-CM | POA: Diagnosis not present

## 2022-10-09 ENCOUNTER — Encounter: Payer: Self-pay | Admitting: Plastic Surgery

## 2022-10-09 ENCOUNTER — Telehealth: Payer: Self-pay | Admitting: Plastic Surgery

## 2022-10-09 NOTE — Telephone Encounter (Signed)
Pt. Called and would like to proceed with Sx.  She has previous [employer] authorization for Dr. Odis Luster with [facility] but he is no longer doing these surgeries.  She does not have to have PT per [facility].  Need route to proceed.

## 2022-10-10 NOTE — Telephone Encounter (Signed)
Alesha called and scheduled pt.

## 2022-11-24 ENCOUNTER — Telehealth: Payer: Self-pay | Admitting: *Deleted

## 2022-11-24 ENCOUNTER — Ambulatory Visit (INDEPENDENT_AMBULATORY_CARE_PROVIDER_SITE_OTHER): Payer: Federal, State, Local not specified - PPO | Admitting: Plastic Surgery

## 2022-11-24 DIAGNOSIS — N62 Hypertrophy of breast: Secondary | ICD-10-CM

## 2022-11-24 NOTE — Telephone Encounter (Signed)
Patient wants me to submit case without PT.

## 2022-11-24 NOTE — Progress Notes (Signed)
Checking to see if we can submit to insurance.  Patient will need to do PT before submission.

## 2022-11-25 ENCOUNTER — Telehealth (INDEPENDENT_AMBULATORY_CARE_PROVIDER_SITE_OTHER): Payer: Federal, State, Local not specified - PPO | Admitting: Plastic Surgery

## 2022-11-25 DIAGNOSIS — N62 Hypertrophy of breast: Secondary | ICD-10-CM

## 2022-11-25 DIAGNOSIS — M546 Pain in thoracic spine: Secondary | ICD-10-CM

## 2022-11-25 DIAGNOSIS — G8929 Other chronic pain: Secondary | ICD-10-CM

## 2022-12-11 NOTE — Telephone Encounter (Signed)
Working on [employer] and pre-auth

## 2022-12-15 NOTE — Therapy (Deleted)
OUTPATIENT PHYSICAL THERAPY CERVICAL EVALUATION   Patient Name: Bonnie [name] MRN: [id number] DOB:Jun 16, 1990, 32 y.o., female Today's Date: 12/15/2022  END OF SESSION:   Past Medical History:  Diagnosis Date   Hx of chlamydia infection    Medical history non-contributory    Urinary tract infection    hx of   Past Surgical History:  Procedure Laterality Date   CHOLECYSTECTOMY N/A 02/07/2022   Procedure: LAPAROSCOPIC CHOLECYSTECTOMY WITH ICG;  Surgeon: Emelia Loron, MD;  Location: [facility] OR;  Service: General;  Laterality: N/A;   DILATION AND EVACUATION     I & D EXTREMITY Left 07/18/2012   Procedure: LEFT IRRIGATION AND DEBRIDEMENT AND REPAIR OF THE INDEX FINGER NERVES AND  TENDON ;  Surgeon: Dominica Severin, MD;  Location: [facility] OR;  Service: Orthopedics;  Laterality: Left;   TONSILLECTOMY     WISDOM TOOTH EXTRACTION     Patient Active Problem List   Diagnosis Date Noted   Back pain 08/08/2022   Symptomatic mammary hypertrophy 08/08/2022   Acute cholecystitis 02/06/2022   Maternal fever during labor, delivered 03/13/2016   Vaginal delivery 03/13/2016   First degree perineal laceration during delivery 03/13/2016    PCP: ***  REFERRING PROVIDER: ***  REFERRING DIAG: ***  THERAPY DIAG:  No diagnosis found.  Rationale for Evaluation and Treatment: {HABREHAB:27488}  ONSET DATE: ***  SUBJECTIVE:                                                                                                                                                                                                         SUBJECTIVE STATEMENT: *** Hand dominance: {MISC; OT HAND DOMINANCE:[phone]}  PERTINENT HISTORY:  ***  PAIN:  Are you having pain? {OPRCPAIN:27236}  PRECAUTIONS: {Therapy precautions:24002}  RED FLAGS: {PT Red Flags:29287}     WEIGHT BEARING RESTRICTIONS: {Yes ***/No:24003}  FALLS:  Has patient fallen in last 6 months? {fallsyesno:27318}  LIVING  ENVIRONMENT: Lives with: {OPRC lives with:25569::"lives with their family"} Lives in: {Lives in:25570} Stairs: {opstairs:27293} Has following equipment at home: {Assistive devices:23999}  OCCUPATION: ***  PLOF: {PLOF:24004}  PATIENT GOALS: ***  NEXT MD VISIT: ***  OBJECTIVE:   DIAGNOSTIC FINDINGS:  ***  PATIENT SURVEYS:  {rehab surveys:24030}  COGNITION: Overall cognitive status: {cognition:24006}  SENSATION: {sensation:27233}  POSTURE: {posture:25561}  PALPATION: ***   CERVICAL ROM:   {AROM/PROM:27142} ROM A/PROM (deg) eval  Flexion   Extension   Right lateral flexion   Left lateral flexion   Right rotation   Left rotation    (Blank rows = not tested)  UPPER EXTREMITY  ROM:  {AROM/PROM:27142} ROM Right eval Left eval  Shoulder flexion    Shoulder extension    Shoulder abduction    Shoulder adduction    Shoulder extension    Shoulder internal rotation    Shoulder external rotation    Elbow flexion    Elbow extension    Wrist flexion    Wrist extension    Wrist ulnar deviation    Wrist radial deviation    Wrist pronation    Wrist supination     (Blank rows = not tested)  UPPER EXTREMITY MMT:  MMT Right eval Left eval  Shoulder flexion    Shoulder extension    Shoulder abduction    Shoulder adduction    Shoulder extension    Shoulder internal rotation    Shoulder external rotation    Middle trapezius    Lower trapezius    Elbow flexion    Elbow extension    Wrist flexion    Wrist extension    Wrist ulnar deviation    Wrist radial deviation    Wrist pronation    Wrist supination    Grip strength     (Blank rows = not tested)  CERVICAL SPECIAL TESTS:  {Cervical special tests:25246}  FUNCTIONAL TESTS:  {Functional tests:24029}  TODAY'S TREATMENT:                                                                                                                              DATE: ***   PATIENT EDUCATION:  Education details:  *** Person educated: {Person educated:25204} Education method: {Education Method:25205} Education comprehension: {Education Comprehension:25206}  HOME EXERCISE PROGRAM: ***  ASSESSMENT:  CLINICAL IMPRESSION: Patient is a *** y.o. *** who was seen today for physical therapy evaluation and treatment for ***.   OBJECTIVE IMPAIRMENTS: {opptimpairments:25111}.   ACTIVITY LIMITATIONS: {activitylimitations:27494}  PARTICIPATION LIMITATIONS: {participationrestrictions:25113}  PERSONAL FACTORS: {Personal factors:25162} are also affecting patient's functional outcome.   REHAB POTENTIAL: {rehabpotential:25112}  CLINICAL DECISION MAKING: {clinical decision making:25114}  EVALUATION COMPLEXITY: {Evaluation complexity:25115}   GOALS: Goals reviewed with patient? {yes/no:20286}  SHORT TERM GOALS: Target date: ***  *** Baseline:  Goal status: INITIAL  2.  *** Baseline:  Goal status: INITIAL  3.  *** Baseline:  Goal status: INITIAL  4.  *** Baseline:  Goal status: INITIAL  5.  *** Baseline:  Goal status: INITIAL  6.  *** Baseline:  Goal status: INITIAL  LONG TERM GOALS: Target date: ***  *** Baseline:  Goal status: INITIAL  2.  *** Baseline:  Goal status: INITIAL  3.  *** Baseline:  Goal status: INITIAL  4.  *** Baseline:  Goal status: INITIAL  5.  *** Baseline:  Goal status: INITIAL  6.  *** Baseline:  Goal status: INITIAL   PLAN:  PT FREQUENCY: {rehab frequency:25116}  PT DURATION: {rehab duration:25117}  PLANNED INTERVENTIONS: {rehab planned interventions:25118::"Therapeutic exercises","Therapeutic activity","Neuromuscular re-education","Balance training","Gait training","Patient/Family education","Self Care","Joint mobilization"}  PLAN FOR NEXT SESSION: ***  ,, PT 12/15/2022, 10:21 AM

## 2022-12-18 ENCOUNTER — Ambulatory Visit: Payer: Federal, State, Local not specified - PPO | Attending: Plastic Surgery | Admitting: Physical Therapy

## 2022-12-26 ENCOUNTER — Telehealth: Payer: Self-pay | Admitting: *Deleted

## 2022-12-26 NOTE — Telephone Encounter (Signed)
LVM to schedule surgery

## 2022-12-27 ENCOUNTER — Telehealth: Payer: Self-pay | Admitting: *Deleted

## 2022-12-27 NOTE — Telephone Encounter (Signed)
Spoke with patient to schedule sx and related appts 

## 2023-01-22 ENCOUNTER — Encounter: Payer: Federal, State, Local not specified - PPO | Admitting: Student

## 2023-02-19 ENCOUNTER — Ambulatory Visit (INDEPENDENT_AMBULATORY_CARE_PROVIDER_SITE_OTHER): Payer: Federal, State, Local not specified - PPO | Admitting: Student

## 2023-02-19 ENCOUNTER — Encounter: Payer: Federal, State, Local not specified - PPO | Admitting: Student

## 2023-02-19 VITALS — BP 116/71 | HR 67

## 2023-02-19 DIAGNOSIS — N62 Hypertrophy of breast: Secondary | ICD-10-CM

## 2023-02-19 MED ORDER — CEPHALEXIN 500 MG PO CAPS: 500.0000 mg | ORAL_CAPSULE | Freq: Four times a day (QID) | ORAL | 0 refills | Status: AC

## 2023-02-19 MED ORDER — ONDANSETRON HCL 4 MG PO TABS: 4.0000 mg | ORAL_TABLET | Freq: Three times a day (TID) | ORAL | 0 refills | Status: DC | PRN

## 2023-02-19 MED ORDER — OXYCODONE HCL 5 MG PO TABS: 5.0000 mg | ORAL_TABLET | Freq: Four times a day (QID) | ORAL | 0 refills | Status: DC | PRN

## 2023-02-19 NOTE — Progress Notes (Signed)
Patient ID: Bonnie [name], female    DOB: October 16, 1990, 32 y.o.   MRN: [id number]  Chief Complaint  Patient presents with   Pre-op Exam      ICD-10-CM   1. Symptomatic mammary hypertrophy  N62 Nicotine/cotinine metabolites       History of Present Illness: Bonnie [name] is a 32 y.o.  female  with a history of macromastia.  She presents for preoperative evaluation for upcoming procedure, Bilateral Breast Reduction, scheduled for 03/19/23 with Dr.  Ulice Bold  The patient has not had problems with anesthesia.  Patient denies any personal family history of breast cancer.  She denies any history of cardiac diseases.  She denies taking any blood thinners.  Patient reports she stopped smoking 3 months ago.  Patient states she currently has a Mirena IUD in place.  She denies any history of miscarriages.  She denies any personal family history of blood clots or clotting diseases.  She denies any recent surgeries, traumas, infections.  She denies any history of stroke or heart attack.  She denies any history of Crohn's disease or ulcerative colitis.  She denies any history of asthma or cancer.  She denies any varicosities to her lower extremities.  She denies any recent fevers or chills.  Patient reports she is currently a 69F cup, and would like to be a full C/D cup.  Discussed with patient that cup size cannot be guaranteed.  Patient expressed understanding.  Summary of Previous Visit: Patient was seen for initial consult by Dr. Ulice Bold on 08/08/2022.  At this visit, patient complained of back pain and neck pain due to her enlarged breasts.  On exam, her STN on the right was 31 cm and her STN on the left was 31 cm.  Her BMI was 32.8 kg/m.  Her preoperative bra size was an F cup, patient reported she wanted to be a C/D cup.  Estimated excess breast tissue to be removed at time of surgery: 630 grams  Job: Works as a Doctor, hospital, planning to take 2 weeks off and then returning to  work light duty until 6 weeks postop.  PMH Significant for: Cholecystitis, symptomatic mammary hypertrophy   Past Medical History: Allergies: No Known Allergies  Current Medications:  Current Outpatient Medications:    acetaminophen (TYLENOL) 500 MG tablet, Take 2 tablets (1,000 mg total) by mouth every 8 (eight) hours as needed., Disp: 30 tablet, Rfl: 0   ciprofloxacin (CIPRO) 500 MG tablet, Take 1 tablet (500 mg total) by mouth 2 (two) times daily. One po bid x 7 days, Disp: 14 tablet, Rfl: 0   fluticasone (FLONASE) 50 MCG/ACT nasal spray, Place into both nostrils., Disp: , Rfl:    ibuprofen (ADVIL) 200 MG tablet, Take 400 mg by mouth every 6 (six) hours as needed for mild pain or moderate pain., Disp: , Rfl:    lidocaine (XYLOCAINE) 2 % solution, 5 mLs every 6 (six) hours as needed., Disp: , Rfl:    Multiple Vitamin (MULTI-VITAMIN DAILY PO), Take 1 tablet by mouth as needed (for vitamin levels)., Disp: , Rfl:   Past Medical Problems: Past Medical History:  Diagnosis Date   Hx of chlamydia infection    Medical history non-contributory    Urinary tract infection    hx of    Past Surgical History: Past Surgical History:  Procedure Laterality Date   CHOLECYSTECTOMY N/A 02/07/2022   Procedure: LAPAROSCOPIC CHOLECYSTECTOMY WITH ICG;  Surgeon: Emelia Loron, MD;  Location: [facility] OR;  Service: General;  Laterality: N/A;   DILATION AND EVACUATION     I & D EXTREMITY Left 07/18/2012   Procedure: LEFT IRRIGATION AND DEBRIDEMENT AND REPAIR OF THE INDEX FINGER NERVES AND  TENDON ;  Surgeon: Dominica Severin, MD;  Location: [facility] OR;  Service: Orthopedics;  Laterality: Left;   TONSILLECTOMY     WISDOM TOOTH EXTRACTION      Social History: Social History   Socioeconomic History   Marital status: Single    Spouse name: Not on file   Number of children: Not on file   Years of education: Not on file   Highest education level: Not on file  Occupational History   Not on file  Tobacco Use    Smoking status: Former    Current packs/day: 0.25    Average packs/day: 0.3 packs/day for 2.0 years (0.5 ttl pk-yrs)    Types: Cigars, Cigarettes   Smokeless tobacco: Never  Substance and Sexual Activity   Alcohol use: No    Alcohol/week: 0.0 standard drinks of alcohol    Comment: oaccasionally   Drug use: Yes    Types: Marijuana    Comment: last used when found out pregant   Sexual activity: Yes    Birth control/protection: None    Comment: last had intercourse on 08/10/15  Other Topics Concern   Not on file  Social History Narrative   Not on file   Social Determinants of Health   Financial Resource Strain: Not on file  Food Insecurity: Patient Declined (02/06/2022)   Hunger Vital Sign    Worried About Running Out of Food in the Last Year: Patient declined    Ran Out of Food in the Last Year: Patient declined  Transportation Needs: Patient Declined (02/06/2022)   PRAPARE - Administrator, Civil Service (Medical): Patient declined    Lack of Transportation (Non-Medical): Patient declined  Physical Activity: Not on file  Stress: Not on file  Social Connections: Unknown (09/18/2021)   Received from [facility]   Social Network    Social Network: Not on file  Intimate Partner Violence: Patient Declined (02/06/2022)   Humiliation, Afraid, Rape, and Kick questionnaire    Fear of Current or Ex-Partner: Patient declined    Emotionally Abused: Patient declined    Physically Abused: Patient declined    Sexually Abused: Patient declined    Family History: Family History  Problem Relation Age of Onset   Hypertension Mother    Hypertension Maternal Grandmother    Varicose Veins Maternal Grandmother     Review of Systems: Denies any recent fevers or chills  Physical Exam: Vital Signs BP 116/71 (BP Location: Left Arm, Patient Position: Sitting, Cuff Size: Large)   Pulse 67   SpO2 96%   Physical Exam  Constitutional:      General: Not in acute  distress.    Appearance: Normal appearance. Not ill-appearing.  HENT:     Head: Normocephalic and atraumatic.  Neck:     Musculoskeletal: Normal range of motion.  Cardiovascular:     Rate and Rhythm: Normal rate Pulmonary:     Effort: Pulmonary effort is normal. No respiratory distress.  Musculoskeletal: Normal range of motion.  Skin:    General: Skin is warm and dry.     Findings: No erythema or rash.  Neurological:      Mental Status: Alert and oriented to person, place, and time. Mental status is at baseline.  Psychiatric:  Mood and Affect: Mood normal.        Behavior: Behavior normal.    Assessment/Plan: The patient is scheduled for bilateral breast reduction with Dr. Ulice Bold.  Risks, benefits, and alternatives of procedure discussed, questions answered and consent obtained.    Smoking Status: Stop smoking 3 months ago; Counseling Given?  Counseling was given, will also obtain nicotine metabolite test.  Discussed with patient to get this done as soon as possible. Last Mammogram: N/A due to age;  Caprini Score: 3; Risk Factors include: Age, BMI > 25, and length of planned surgery. Recommendation for mechanical  prophylaxis. Encourage early ambulation.   Pictures obtained: @consult   Post-op Rx sent to pharmacy: Oxycodone, Zofran, Keflex  Instructed patient to hold multivitamins and Advil 1 week prior to surgery.  Patient expressed understanding.  Patient was provided with the breast reduction and General Surgical Risk consent document and Pain Medication Agreement prior to their appointment.  They had adequate time to read through the risk consent documents and Pain Medication Agreement. We also discussed them in person together during this preop appointment. All of their questions were answered to their satisfaction.  Recommended calling if they have any further questions.  Risk consent form and Pain Medication Agreement to be scanned into patient's chart.  The risk  that can be encountered with breast reduction were discussed and include the following but not limited to these:  Breast asymmetry, fluid accumulation, firmness of the breast, inability to breast feed, loss of nipple or areola, skin loss, decrease or no nipple sensation, fat necrosis of the breast tissue, bleeding, infection, healing delay.  There are risks of anesthesia, changes to skin sensation and injury to nerves or blood vessels.  The muscle can be temporarily or permanently injured.  You may have an allergic reaction to tape, suture, glue, blood products which can result in skin discoloration, swelling, pain, skin lesions, poor healing.  Any of these can lead to the need for revisonal surgery or stage procedures.  A reduction has potential to interfere with diagnostic procedures.  Nipple or breast piercing can increase risks of infection.  This procedure is best done when the breast is fully developed.  Changes in the breast will continue to occur over time.  Pregnancy can alter the outcomes of previous breast reduction surgery, weight gain and weigh loss can also effect the long term appearance.     Electronically signed by: Laurena Spies, PA-C 02/19/2023 11:24 AM

## 2023-02-27 ENCOUNTER — Encounter: Payer: Federal, State, Local not specified - PPO | Admitting: Plastic Surgery

## 2023-03-12 DIAGNOSIS — L219 Seborrheic dermatitis, unspecified: Secondary | ICD-10-CM | POA: Diagnosis not present

## 2023-03-12 DIAGNOSIS — N62 Hypertrophy of breast: Secondary | ICD-10-CM | POA: Diagnosis not present

## 2023-03-15 LAB — NICOTINE/COTININE METABOLITES
Cotinine: <1 ng/mL
Nicotine: <1 ng/mL

## 2023-03-19 ENCOUNTER — Other Ambulatory Visit: Payer: Self-pay | Admitting: Plastic Surgery

## 2023-03-19 DIAGNOSIS — N62 Hypertrophy of breast: Secondary | ICD-10-CM | POA: Diagnosis not present

## 2023-03-20 ENCOUNTER — Telehealth: Payer: Self-pay

## 2023-03-20 LAB — SURGICAL PATHOLOGY

## 2023-03-20 NOTE — Telephone Encounter (Signed)
Patient called and complained about having a fever of 100.9 with no other symptoms. Advised her that was normal. However, if it goes above 101 to call our office. Keep hydrated and drink plenty of water is very important. The last time she took pain medication was oxycodone at 8:00am this morning.To stay ahead of the pain, alternate IBU, tylenol every 3 hours and take the oxycodone before she goes to bed.. Suggested to set an alarm every 3 hours and continue taking meds during the night through the morning.  Also advised to take something for constipation as long as the was taking the oxycodone.  Patient understood and agreed with plan.

## 2023-03-27 ENCOUNTER — Encounter: Payer: Self-pay | Admitting: Plastic Surgery

## 2023-03-27 ENCOUNTER — Ambulatory Visit: Payer: Federal, State, Local not specified - PPO | Admitting: Plastic Surgery

## 2023-03-27 VITALS — BP 124/85 | HR 68 | Ht 63.0 in | Wt 192.4 lb

## 2023-03-27 DIAGNOSIS — N62 Hypertrophy of breast: Secondary | ICD-10-CM

## 2023-03-27 NOTE — Progress Notes (Signed)
The patient is a 32 year old female here for follow-up after undergoing bilateral breast reduction.  The patient surgery was a week ago and she is doing very well.  I replaced some of the Steri-Strips.  There is no sign of infection or hematoma.  Continue with the sports bra and follow-up in 2 weeks.  Pictures were obtained of the patient and placed in the chart with the patient's or guardian's permission.

## 2023-04-02 ENCOUNTER — Encounter: Payer: Self-pay | Admitting: Student

## 2023-04-02 ENCOUNTER — Ambulatory Visit (INDEPENDENT_AMBULATORY_CARE_PROVIDER_SITE_OTHER): Payer: Federal, State, Local not specified - PPO | Admitting: Student

## 2023-04-02 VITALS — BP 113/73 | HR 84

## 2023-04-02 DIAGNOSIS — N62 Hypertrophy of breast: Secondary | ICD-10-CM

## 2023-04-02 NOTE — Progress Notes (Signed)
Patient is a 32 year old female who underwent bilateral breast reduction with Dr. Ulice Bold on 03/19/2023.  She is 2 weeks postop.  She presents to the clinic today with concerns about some drainage from her breast.  Patient was last seen in the clinic on 03/27/2023.  At this visit, patient was doing well.  There is no sign of infection or hematoma.  Today, patient reports she is doing well.  She states that starting on Friday, she noticed drainage from her left breast.  She states that the drainage has been pretty steady and copious over the weekend and she was concerned.  She denies any fevers or chills.  She states that her breasts are sore, but denies any increasing pain or redness.  She states otherwise she is doing really well since surgery.  Chaperone present on exam.  On exam, patient is sitting upright in no acute distress.  Breasts are soft and fairly symmetric.  Left breast is slightly more full than the right breast.  There is no overlying erythema.  No obvious fluid collections on exam, possibly a little bit of fluid to the left breast.  NAC's appear to be healthy bilaterally.  Incisions to the right breast are intact and healing well.  There is a small wound noted at the inferior T-zone of the left breast.  No signs of infection on exam.  No fluid was expressed through the wound on the left breast.  No active drainage on exam.  Did discuss with the patient that we could try aspirating her left breast that she may have some fluid in there.  Also discussed conservative management with continued monitoring and allowing the drainage to come out through the wound.  Patient opted to continue with conservative management.  Discussed with her that she may use maxi pads or ABD pads if she continues to drain.  Patient expressed understanding.  Discussed with patient that she can apply Vaseline to her incisions daily.  Instructed her to wear compression at all times.  Patient expressed  understanding.  Patient to follow back up next week for her scheduled appointment.  I instructed her to monitor her breast closely.  If she were to develop any redness, pain, increased swelling, change in the drainage characteristic, fevers or chills, patient is aware to call us.  I instructed her to otherwise call in the meantime she has any questions or concerns about anything.

## 2023-04-09 ENCOUNTER — Encounter: Payer: Self-pay | Admitting: Student

## 2023-04-11 ENCOUNTER — Encounter: Payer: Self-pay | Admitting: Student

## 2023-04-11 ENCOUNTER — Ambulatory Visit: Payer: Federal, State, Local not specified - PPO | Admitting: Student

## 2023-04-11 VITALS — BP 143/83 | HR 64 | Ht 63.0 in | Wt 196.0 lb

## 2023-04-11 DIAGNOSIS — N62 Hypertrophy of breast: Secondary | ICD-10-CM

## 2023-04-11 NOTE — Progress Notes (Signed)
Patient is a 32 year old female who underwent bilateral breast reduction with Dr. Ulice Bold on 03/19/2023.  She is a little over 3 weeks postop.  She presents to the clinic today for postoperative follow-up.  Patient was last seen in the clinic on 04/02/2023.  At this visit, patient was doing well.  She reported that she was noticing some drainage from her left breast.  On exam, left breast was slightly more full than the right breast.  No overlying erythema.  No obvious fluid collections.  NAC's were healthy.  Incisions to the right breast were intact and healing well.  Small wound to the inferior T-zone.  Plan is for patient to apply Vaseline to her incisions daily and closely monitor her breasts.  Today, patient reports she is doing okay.  She states that this past Sunday Monday, she is still having drainage from her left breast.  She denies any fevers or chills.  Denies any increased pain.  Denies any other issues or concerns.  Chaperone present on exam.  On exam, patient is sitting upright in no acute distress.  Breasts are soft and fairly symmetric.  There are no obvious fluid collections palpated on exam.  No overlying erythema.  No tenderness to palpation.  NAC's are healthy bilaterally.  There is an approximately 2 cm x 1 cm wound noted to the inferior T-zone of the left breast.  I was not able to express any fluid or drainage from this area.  There is no surrounding erythema.  No signs of infection on exam.  Incisions overall are otherwise intact and healing well.  There is still some residual Dermabond noted.  I did discuss with the patient that given that she is still draining, she may have a little bit of fluid in her breast.  Discussed that there is no obvious fluid collection palpated on exam.  We did discuss possible aspiration versus ultrasound versus continued monitoring and drainage.  Patient opted to continue to monitor.  Discussed with patient would like her to apply calcium alginate  over her wound daily.  Discussed with her she may apply dressings over the calcium alginate for drainage at daily.  Patient expressed understanding.  Recommended she apply Vaseline to the remainder of her incisions.  Discussed with patient that she needs to wear compression at all times.  She expressed understanding.  Patient to follow back up next week.  Instructed her to call in the meantime if she has any questions or concerns about anything.  Pictures were obtained of the patient and placed in the chart with the patient's or guardian's permission.

## 2023-04-17 ENCOUNTER — Ambulatory Visit: Payer: Federal, State, Local not specified - PPO | Admitting: Surgical

## 2023-04-17 ENCOUNTER — Encounter: Payer: Self-pay | Admitting: Surgical

## 2023-04-17 VITALS — BP 122/75 | HR 60

## 2023-04-17 DIAGNOSIS — M546 Pain in thoracic spine: Secondary | ICD-10-CM

## 2023-04-17 DIAGNOSIS — G8929 Other chronic pain: Secondary | ICD-10-CM

## 2023-04-17 DIAGNOSIS — N62 Hypertrophy of breast: Secondary | ICD-10-CM

## 2023-04-17 NOTE — Progress Notes (Signed)
32 year old female who underwent bilateral breast reduction with Dr. Ulice Bold on 03/19/2023.  She is 1 month postop.  She reports overall she is doing well.  She has been applying alginate to the left T-junction wound.  She feels as if things are beginning to slowly improved.  She is not having any infectious symptoms today.  She does have questions about returning to work.  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  Chaperone present on exam On exam bilateral NAC's are viable, bilateral breast incisions are overall intact and appear to be healing well.  She does have a wound at the left T-junction that is approximately 0.5 x 0.5 x 0.25 cm.  There is no surrounding erythema or cellulitic changes.  No active drainage noted today.  No subcutaneous fluid collection noted with palpation of either breast. She does have a few sutures protruding through the skin in various spots.  A/P:  Patient is doing well after bilateral breast reduction 1 month ago.  We discussed ongoing restrictions for 2 more weeks.  She can return to work in 2 weeks with no restrictions as tolerated.  We did discuss just transition to Vaseline and Mepilex border dressings to the left breast wound instead of the calcium alginate as the wound does not appear to be draining much at this time.  We discussed that if she does begin to notice increased drainage she can switch back to the alginate.  There is no signs infection or concern on exam.  All of her questions were answered to her content.  We will plan to see her back in about 2 weeks for reevaluation.  Pictures were obtained of the patient and placed in the chart with the patient's or guardian's permission.

## 2023-04-23 ENCOUNTER — Encounter: Payer: Federal, State, Local not specified - PPO | Admitting: Student

## 2023-04-24 ENCOUNTER — Encounter: Payer: Federal, State, Local not specified - PPO | Admitting: Surgical

## 2023-05-08 ENCOUNTER — Telehealth: Payer: Self-pay | Admitting: Plastic Surgery

## 2023-05-08 ENCOUNTER — Encounter: Payer: Self-pay | Admitting: Plastic Surgery

## 2023-05-08 ENCOUNTER — Ambulatory Visit (INDEPENDENT_AMBULATORY_CARE_PROVIDER_SITE_OTHER): Payer: Federal, State, Local not specified - PPO | Admitting: Plastic Surgery

## 2023-05-08 VITALS — BP 129/78 | HR 61

## 2023-05-08 DIAGNOSIS — G4719 Other hypersomnia: Secondary | ICD-10-CM | POA: Diagnosis not present

## 2023-05-08 DIAGNOSIS — N62 Hypertrophy of breast: Secondary | ICD-10-CM

## 2023-05-08 NOTE — Progress Notes (Signed)
 Is a 32 year old female here for follow-up after undergoing bilateral breast reduction in November.  She had a little bit of breakdown on the left inframammary fold.  That is completely healed overall she is doing extremely well.  She probably still has just a little bit of swelling and that will improve over the next couple of months.  Pictures were obtained of the patient and placed in the chart with the patient's or guardian's permission.  Follow-up as needed.

## 2023-05-08 NOTE — Telephone Encounter (Signed)
Pt called and wants to know if she is supposed to have any restrictions. Either or to have her letter updated please. Please advise

## 2023-05-08 NOTE — Telephone Encounter (Signed)
 No restrictions based on her surgical date

## 2023-05-14 ENCOUNTER — Encounter: Payer: Self-pay | Admitting: Plastic Surgery

## 2023-07-02 ENCOUNTER — Other Ambulatory Visit (HOSPITAL_COMMUNITY)
Admission: RE | Admit: 2023-07-02 | Discharge: 2023-07-02 | Disposition: A | Discharge status: Home / Self Care | Payer: BLUE CROSS/BLUE SHIELD | Source: Ambulatory Visit | Attending: Obstetrics and Gynecology | Admitting: Obstetrics and Gynecology

## 2023-07-02 ENCOUNTER — Other Ambulatory Visit: Payer: Self-pay | Admitting: Obstetrics and Gynecology

## 2023-07-02 DIAGNOSIS — Z01419 Encounter for gynecological examination (general) (routine) without abnormal findings: Secondary | ICD-10-CM | POA: Insufficient documentation

## 2023-07-05 LAB — CYTOLOGY - PAP
Chlamydia: NEGATIVE
Comment: NEGATIVE
Comment: NEGATIVE
Comment: NORMAL
Diagnosis: NEGATIVE
High risk HPV: NEGATIVE
Neisseria Gonorrhea: NEGATIVE
